# Patient Record
Sex: Male | Born: 1935 | Race: White | Hispanic: No | Marital: Married | Smoking: Former smoker
Health system: Southern US, Community
[De-identification: ages and names within clinical notes are randomized; demographics above are authoritative.]

## PROBLEM LIST (undated history)

## (undated) DIAGNOSIS — K819 Cholecystitis, unspecified: Secondary | ICD-10-CM

## (undated) DIAGNOSIS — I10 Essential (primary) hypertension: Secondary | ICD-10-CM

## (undated) DIAGNOSIS — R109 Unspecified abdominal pain: Secondary | ICD-10-CM

## (undated) DIAGNOSIS — I2510 Atherosclerotic heart disease of native coronary artery without angina pectoris: Secondary | ICD-10-CM

## (undated) DIAGNOSIS — R9430 Abnormal result of cardiovascular function study, unspecified: Secondary | ICD-10-CM

## (undated) DIAGNOSIS — G545 Neuralgic amyotrophy: Secondary | ICD-10-CM

## (undated) DIAGNOSIS — R05 Cough: Secondary | ICD-10-CM

## (undated) DIAGNOSIS — E785 Hyperlipidemia, unspecified: Secondary | ICD-10-CM

## (undated) DIAGNOSIS — IMO0002 Reserved for concepts with insufficient information to code with codable children: Secondary | ICD-10-CM

## (undated) DIAGNOSIS — I739 Peripheral vascular disease, unspecified: Secondary | ICD-10-CM

## (undated) DIAGNOSIS — Z951 Presence of aortocoronary bypass graft: Secondary | ICD-10-CM

## (undated) DIAGNOSIS — M1A9XX0 Chronic gout, unspecified, without tophus (tophi): Secondary | ICD-10-CM

## (undated) DIAGNOSIS — R9389 Abnormal findings on diagnostic imaging of other specified body structures: Secondary | ICD-10-CM

## (undated) DIAGNOSIS — M5030 Other cervical disc degeneration, unspecified cervical region: Secondary | ICD-10-CM

## (undated) DIAGNOSIS — G5600 Carpal tunnel syndrome, unspecified upper limb: Secondary | ICD-10-CM

## (undated) DIAGNOSIS — L4050 Arthropathic psoriasis, unspecified: Secondary | ICD-10-CM

## (undated) DIAGNOSIS — R52 Pain, unspecified: Secondary | ICD-10-CM

## (undated) DIAGNOSIS — M109 Gout, unspecified: Secondary | ICD-10-CM

## (undated) DIAGNOSIS — L409 Psoriasis, unspecified: Principal | ICD-10-CM

## (undated) DIAGNOSIS — M1990 Unspecified osteoarthritis, unspecified site: Secondary | ICD-10-CM

## (undated) DIAGNOSIS — M5136 Other intervertebral disc degeneration, lumbar region: Secondary | ICD-10-CM

## (undated) DIAGNOSIS — N200 Calculus of kidney: Secondary | ICD-10-CM

## (undated) DIAGNOSIS — R945 Abnormal results of liver function studies: Secondary | ICD-10-CM

## (undated) DIAGNOSIS — R7989 Other specified abnormal findings of blood chemistry: Secondary | ICD-10-CM

## (undated) DIAGNOSIS — N289 Disorder of kidney and ureter, unspecified: Secondary | ICD-10-CM

## (undated) DIAGNOSIS — I252 Old myocardial infarction: Secondary | ICD-10-CM

## (undated) DIAGNOSIS — Z79899 Other long term (current) drug therapy: Secondary | ICD-10-CM

## (undated) DIAGNOSIS — M810 Age-related osteoporosis without current pathological fracture: Secondary | ICD-10-CM

## (undated) DIAGNOSIS — E119 Type 2 diabetes mellitus without complications: Secondary | ICD-10-CM

## (undated) DIAGNOSIS — K859 Acute pancreatitis without necrosis or infection, unspecified: Secondary | ICD-10-CM

## (undated) DIAGNOSIS — I779 Disorder of arteries and arterioles, unspecified: Secondary | ICD-10-CM

## (undated) DIAGNOSIS — T464X5A Adverse effect of angiotensin-converting-enzyme inhibitors, initial encounter: Secondary | ICD-10-CM

## (undated) HISTORY — DX: Old myocardial infarction: I25.2

## (undated) HISTORY — PX: CARDIAC CATHETERIZATION: SHX172

## (undated) HISTORY — DX: Disorder of arteries and arterioles, unspecified: I77.9

## (undated) HISTORY — DX: Atherosclerotic heart disease of native coronary artery without angina pectoris: I25.10

## (undated) HISTORY — DX: Abnormal findings on diagnostic imaging of other specified body structures: R93.89

## (undated) HISTORY — DX: Other specified abnormal findings of blood chemistry: R79.89

## (undated) HISTORY — DX: Arthropathic psoriasis, unspecified: L40.50

## (undated) HISTORY — DX: Adverse effect of angiotensin-converting-enzyme inhibitors, initial encounter: T46.4X5A

## (undated) HISTORY — DX: Unspecified osteoarthritis, unspecified site: M19.90

## (undated) HISTORY — DX: Abnormal result of cardiovascular function study, unspecified: R94.30

## (undated) HISTORY — DX: Reserved for concepts with insufficient information to code with codable children: IMO0002

## (undated) HISTORY — PX: CHOLECYSTECTOMY: SHX55

## (undated) HISTORY — DX: Cough: R05

## (undated) HISTORY — DX: Carpal tunnel syndrome, unspecified upper limb: G56.00

## (undated) HISTORY — DX: Psoriasis, unspecified: L40.9

## (undated) HISTORY — DX: Cholecystitis, unspecified: K81.9

## (undated) HISTORY — DX: Hyperlipidemia, unspecified: E78.5

## (undated) HISTORY — DX: Age-related osteoporosis without current pathological fracture: M81.0

## (undated) HISTORY — DX: Disorder of kidney and ureter, unspecified: N28.9

## (undated) HISTORY — DX: Other cervical disc degeneration, unspecified cervical region: M50.30

## (undated) HISTORY — DX: Peripheral vascular disease, unspecified: I73.9

## (undated) HISTORY — PX: CORONARY ARTERY BYPASS GRAFT: SHX141

## (undated) HISTORY — PX: SPINAL FUSION: SHX223

## (undated) HISTORY — DX: Type 2 diabetes mellitus without complications: E11.9

## (undated) HISTORY — DX: Essential (primary) hypertension: I10

## (undated) HISTORY — DX: Acute pancreatitis without necrosis or infection, unspecified: K85.90

## (undated) HISTORY — DX: Neuralgic amyotrophy: G54.5

## (undated) HISTORY — DX: Abnormal results of liver function studies: R94.5

## (undated) HISTORY — DX: Gout, unspecified: M10.9

## (undated) HISTORY — DX: Calculus of kidney: N20.0

## (undated) HISTORY — DX: Chronic gout, unspecified, without tophus (tophi): M1A.9XX0

## (undated) HISTORY — DX: Pain, unspecified: R52

## (undated) HISTORY — DX: Other long term (current) drug therapy: Z79.899

## (undated) HISTORY — DX: Presence of aortocoronary bypass graft: Z95.1

## (undated) HISTORY — DX: Unspecified abdominal pain: R10.9

## (undated) HISTORY — DX: Other intervertebral disc degeneration, lumbar region: M51.36

---

## 2003-08-20 HISTORY — PX: CORONARY ARTERY BYPASS GRAFT: SHX141

## 2004-02-10 ENCOUNTER — Inpatient Hospital Stay (HOSPITAL_COMMUNITY): Admission: RE | Admit: 2004-02-10 | Discharge: 2004-02-22 | Payer: Self-pay | Admitting: Cardiothoracic Surgery

## 2004-02-13 HISTORY — PX: LEFT HEART CATH AND CORONARY ANGIOGRAPHY: CATH118249

## 2004-03-19 ENCOUNTER — Encounter: Admission: RE | Admit: 2004-03-19 | Discharge: 2004-03-19 | Payer: Self-pay | Admitting: Neurosurgery

## 2004-05-02 ENCOUNTER — Encounter: Admission: RE | Admit: 2004-05-02 | Discharge: 2004-05-02 | Payer: Self-pay | Admitting: Neurosurgery

## 2004-11-12 ENCOUNTER — Ambulatory Visit: Payer: Self-pay | Admitting: Cardiology

## 2005-12-04 ENCOUNTER — Ambulatory Visit: Payer: Self-pay | Admitting: Cardiology

## 2007-01-21 ENCOUNTER — Ambulatory Visit: Payer: Self-pay | Admitting: Cardiology

## 2008-01-20 ENCOUNTER — Ambulatory Visit: Payer: Self-pay | Admitting: Cardiology

## 2009-02-17 ENCOUNTER — Encounter: Payer: Self-pay | Admitting: Cardiology

## 2009-02-21 ENCOUNTER — Ambulatory Visit: Payer: Self-pay | Admitting: Cardiology

## 2009-02-28 ENCOUNTER — Encounter: Payer: Self-pay | Admitting: Cardiology

## 2009-10-03 ENCOUNTER — Encounter: Payer: Self-pay | Admitting: Cardiology

## 2010-02-16 DIAGNOSIS — R52 Pain, unspecified: Secondary | ICD-10-CM

## 2010-02-16 HISTORY — DX: Pain, unspecified: R52

## 2010-02-28 ENCOUNTER — Encounter: Payer: Self-pay | Admitting: Cardiology

## 2010-03-01 ENCOUNTER — Ambulatory Visit: Payer: Self-pay | Admitting: Cardiology

## 2010-03-20 ENCOUNTER — Telehealth (INDEPENDENT_AMBULATORY_CARE_PROVIDER_SITE_OTHER): Payer: Self-pay | Admitting: Radiology

## 2010-03-21 ENCOUNTER — Encounter (HOSPITAL_COMMUNITY): Admission: RE | Admit: 2010-03-21 | Discharge: 2010-05-10 | Payer: Self-pay | Admitting: Cardiology

## 2010-03-21 ENCOUNTER — Ambulatory Visit: Payer: Self-pay

## 2010-03-21 ENCOUNTER — Encounter: Payer: Self-pay | Admitting: Cardiovascular Disease

## 2010-03-21 ENCOUNTER — Ambulatory Visit: Payer: Self-pay | Admitting: Cardiovascular Disease

## 2010-03-21 ENCOUNTER — Encounter: Payer: Self-pay | Admitting: Cardiology

## 2010-09-18 NOTE — Miscellaneous (Signed)
  Clinical Lists Changes  Observations: Added new observation of PAST MED HX:  * NORMAL LV FUNCTION CABG   2005 CAD (ICD-414.00) (no exercise or echo data since CABG as a February 28, 2010) RENAL INSUFFICIENCY (ICD-588.9) DIABETES MELLITUS (ICD-250.00) HYPERLIPIDEMIA (ICD-272.4) HYPERTENSION, BENIGN (ICD-401.1)  (02/28/2010 17:25) Added new observation of PRIMARY MD: Lucila Maine, MD (02/28/2010 17:25)       Past History:  Past Medical History:  * NORMAL LV FUNCTION CABG   2005 CAD (ICD-414.00) (no exercise or echo data since CABG as a February 28, 2010) RENAL INSUFFICIENCY (ICD-588.9) DIABETES MELLITUS (ICD-250.00) HYPERLIPIDEMIA (ICD-272.4) HYPERTENSION, BENIGN (ICD-401.1)

## 2010-09-18 NOTE — Assessment & Plan Note (Signed)
Summary: [facility]   Visit Type:  Follow-up Primary Provider:  Lucila Maine, MD  CC:  CAD.  History of Present Illness: The patient is seen for followup coronary artery disease.  His overall cardiac status appears to be stable.  I saw him last July, 2010.  He is status post CABG in 2005.  He has not had any exercise study since that time.  When I saw him last year I plan see with exercise testing this year.  The patient is bothered by several areas of pain.  He has pain along his right neck to the top of his right shoulder.  There is pain in his right wrist and in his legs.  He had a steroid injection that helped the wrist.  He continues to be bothered however by several areas of pain.  There is no significant shortness of breath.  Current Medications (verified): 1)  Aspirin 81 Mg Tbec (Aspirin) .... Take One Tablet By Mouth Daily 2)  Carvedilol 25 Mg Tabs (Carvedilol) .... Take One Tablet By Mouth Twice A Day 3)  Lotrel 5-20 Mg Caps (Amlodipine Besy-Benazepril Hcl) .... Take One Tablet By Mouth Once Daily. 4)  Crestor 10 Mg Tabs (Rosuvastatin Calcium) .... Take One Tablet By Mouth Daily. 5)  Metformin Hcl 1000 Mg Tabs (Metformin Hcl) .... Take One Tablet By Mouth Twice Daily. 6)  Lisinopril-Hydrochlorothiazide 20-12.5 Mg Tabs (Lisinopril-Hydrochlorothiazide) .... Take One Tablet By Mouth Once Daily. 7)  Finasteride 5 Mg Tabs (Finasteride) .... Take One Tablet By Mouth Once Daily. 8)  Study Drug .... For Gout  Allergies (verified): No Known Drug Allergies  Past History:  Past Medical History:  * NORMAL LV FUNCTION CABG   2005 CAD (ICD-414.00) (no exercise or echo data since CABG as a February 28, 2010) RENAL INSUFFICIENCY (ICD-588.9) DIABETES MELLITUS (ICD-250.00) HYPERLIPIDEMIA (ICD-272.4) HYPERTENSION, BENIGN (ICD-401.1) Pain.... right neck and shoulder.... right wrist.... right leg.... not cardiac... July, 2011  Review of Systems       Patient denies fever, chills, headache, sweats,  rash, change in vision, change in hearing, chest pain, shortness of breath, cough, nausea or vomiting, urinary symptoms.  All of the systems are reviewed and are negative.  Vital Signs:  Patient profile:   77 year old male Height:      70 inches Weight:      186 pounds BMI:     26.78 Pulse rate:   77 / minute Resp:     16 per minute BP sitting:   110 / 66  (left arm)  Vitals Entered By: Marrion Coy, CNA (March 01, 2010 9:31 AM)  Physical Exam  General:  patient is stable.  He does have discomfort in the shoulder wrist and right leg. Head:  head is atraumatic. Eyes:  no xanthelasma. Neck:  no jugular venous distention. Chest Wall:  no chest wall tenderness. Lungs:  lungs are clear.  Respiratory effort is nonlabored. Heart:  cardiac exam reveals S1 and S2.  There is no clicks or significant murmurs. Abdomen:  abdomen is soft. Msk:  no musculoskeletal deformities. Extremities:  no peripheral edema.  There is no heat or redness in the right shoulder or right wrist or in his right leg. Skin:  no skin rashes. Psych:  patient is oriented to person time and place.  Affect is normal.  He is here with his wife today.   Impression & Recommendations:  Problem # 1:  * PAIN RIGHT NECK AND SHOULDER / RIGHT WRIST / RIGHT LEG This time  the patient is bothered by the pain in his neck and shoulder and wrist and right leg.  I feel that these pains are noncardiac in origin.  It is possible that the right neck and shoulder pain may lead to the need for possible surgery.  I had planned to proceed with exercise testing this year as it is 6 years since his CABG.  We will arrange for a Lexiscan Myoview.  Problem # 2:  CAD (ICD-414.00)  Coronary disease appears to be stable at this time.  EKG is done and reviewed by me.  It is normal.  We do need to proceed with a nuclear exercise study.  Orders: EKG w/ Interpretation (93000) Nuclear Stress Test (Nuc Stress Test)  Problem # 3:  HYPERLIPIDEMIA  (ICD-272.4) Lipids are being treated.  Problem # 4:  CAROTID ARTERY DISEASE (ICD-433.10)  The patient has known carotid artery disease.  It is time for followup carotid Doppler.  This will be arranged.  Orders: Carotid Duplex (Carotid Duplex)  Patient Instructions: 1)  Your physician has requested that you have a carotid duplex. This test is an ultrasound of the carotid arteries in your neck. It looks at blood flow through these arteries that supply the brain with blood. Allow one hour for this exam. There are no restrictions or special instructions. 2)  Your physician has requested that you have a Lexiscan myoview.  For further information please visit [email or web address]  Please follow instruction sheet, as given. 3)  Your physician wants you to follow-up in: 1 year.  You will receive a reminder letter in the mail two months in advance. If you don't receive a letter, please call our office to schedule the follow-up appointment.

## 2010-09-18 NOTE — Progress Notes (Signed)
Summary: Nuc pre-procedure  Phone Note Outgoing Call Call back at Home Phone [phone]   Call placed by: Darrick Penna Summary of Call: Reviewed information on Myoview Information Sheet (see scanned document for further details).  Spoke with      Nuclear Med Background Indications for Stress Test: Evaluation for Ischemia, Graft Patency   History: CABG, Heart Catheterization, Myocardial Infarction  History Comments: 05 CABG x3. 05 Cath EF=60%. 05 MI NSTEMI     Nuclear Pre-Procedure Cardiac Risk Factors: Hypertension, Lipids, NIDDM Height (in): 70

## 2010-09-18 NOTE — Assessment & Plan Note (Signed)
Summary: Cardiology Nuclear Testing  Nuclear Med Background Indications for Stress Test: Evaluation for Ischemia, Graft Patency   History: CABG, Heart Catheterization, Myocardial Infarction  History Comments: 05 CABG x3. 05 Cath EF=60%. 05 MI NSTEMI  Symptoms: DOE, Palpitations    Nuclear Pre-Procedure Cardiac Risk Factors: Hypertension, Lipids, NIDDM Caffeine/Decaff Intake: none NPO After: 5:00 PM Lungs: clear IV 0.9% NS with Angio Cath: 22g     IV Site: (R) AC IV Started by: Irean Hong RN Chest Size (in) 42     Height (in): 70 Weight (lb): 182 BMI: 26.21 Tech Comments: Held Carvedilol and Metformin this AM  Nuclear Med Study 1 or 2 day study:  1 day     Stress Test Type:  Eugenie Birks Reading MD:  Charlton Haws, MD     Referring MD:  J.Katz Resting Radionuclide:  Technetium 15m Tetrofosmin     Resting Radionuclide Dose:  10.9 mCi  Stress Radionuclide:  Technetium 39m Tetrofosmin     Stress Radionuclide Dose:  33.0 mCi   Stress Protocol   Lexiscan: 0.4 mg   Stress Test Technologist:  Milana Na EMT-P     Nuclear Technologist:  Domenic Polite CNMT  Rest Procedure  Myocardial perfusion imaging was performed at rest 45 minutes following the intravenous administration of Myoview Technetium 75m Tetrofosmin.  Stress Procedure  The patient received IV Lexiscan 0.4 mg over 15-seconds.  Myoview injected at 30-seconds.  There were no significant changes with infusion.  Quantitative spect images were obtained after a 45 minute delay.  QPS Raw Data Images:  Normal; no motion artifact; normal heart/lung ratio. Stress Images:  NI: Uniform and normal uptake of tracer in all myocardial segments. Rest Images:  Normal homogeneous uptake in all areas of the myocardium. Subtraction (SDS):  0 Transient Ischemic Dilatation:  .92  (Normal <1.22)  Lung/Heart Ratio:  .30  (Normal <0.45)  Quantitative Gated Spect Images QGS EDV:  78 ml QGS ESV:  30 ml QGS EF:  61 % QGS cine  images:  normal  Findings Low risk nuclear study  Evidence for inferior infarct     Overall Impression  Exercise Capacity: Lexiscan BP Response: Normal blood pressure response. Clinical Symptoms: No chest pain ECG Impression: No significant ST segment change suggestive of ischemia. Overall Impression: Small inferior wall infarct at mid and basal level No ishemia  Appended Document: Cardiology Nuclear Testing Good  Appended Document: Cardiology Nuclear Testing pt aware

## 2010-11-05 ENCOUNTER — Other Ambulatory Visit (HOSPITAL_COMMUNITY): Payer: Self-pay | Admitting: Rheumatology

## 2010-11-05 ENCOUNTER — Ambulatory Visit (HOSPITAL_COMMUNITY)
Admission: RE | Admit: 2010-11-05 | Discharge: 2010-11-05 | Disposition: A | Discharge status: Home / Self Care | Payer: Medicare Other | Source: Ambulatory Visit | Attending: Rheumatology | Admitting: Rheumatology

## 2010-11-05 DIAGNOSIS — Z01818 Encounter for other preprocedural examination: Secondary | ICD-10-CM | POA: Insufficient documentation

## 2010-11-05 DIAGNOSIS — D869 Sarcoidosis, unspecified: Secondary | ICD-10-CM

## 2011-01-01 NOTE — Assessment & Plan Note (Signed)
[facility]                            CARDIOLOGY OFFICE NOTE   Dan Holmes, Dan Holmes                  MRN:          [id number]  DATE:01/20/2008                            DOB:          01/03/1932    Dan Holmes is doing well from the cardiac viewpoint.  He is not  having shortness of breath (which was his original anginal symptom).  He  is active.  He is having some continued back difficulties.  I encouraged  him to see Dr. Gerlene Fee again, and I am sending a copy of this note to  Dr. Gerlene Fee.  The patient is post CABG from 2005, and he is not having  any chest pain.   PAST MEDICAL HISTORY:   ALLERGIES:  No known drug allergies.   MEDICATIONS:  1. Aspirin.  2. Coreg.  3. Lotrel.  4. Vytorin (to be switched to Crestor soon).  5. Allopurinol.  6. Finasteride.  7. Metformin.  8. Lisinopril/hydrochlorothiazide.   OTHER MEDICAL PROBLEMS:  See the list below.   REVIEW OF SYSTEMS:  Other than his back discomfort, he feels fine.  Review of systems otherwise is negative.   PHYSICAL EXAMINATION:  Weight is 206 pounds, which is increased by 4  pounds since last year.  We discussed this issue at length.  Blood  pressure is 130/70 with a pulse of 72.  The patient is oriented to person, time, and place.  Affect is normal.  He is here with his wife today.  HEENT:  No xanthelasma.  He has normal extraocular motion.  There are no  carotid bruits.  There is no jugular venous distention.  LUNGS:  Clear.  Respiratory effort is not labored.  CARDIAC:  S1 with an S2.  There are no clicks or significant murmurs.  ABDOMEN:  Soft.  He has no peripheral edema.   Labs sent from Dr. Lorin Picket reveal an HDL of 42 and an LDL of 74.  His EKG  today shows a sinus rhythm.  Creatinine was 1.4.   PROBLEMS:  1. Hypertension, treated.  2. Hyperlipidemia, being treated well by Dr. Lorin Picket.  3. Diabetes, treated.  4. History of renal insufficiency.  His creatinine has  been as high as      1.7 by history and most recently is 1.4.  5. Non-ST elevation myocardial infarction at the time when I first met      him as he came in to the recovery room after neurosurgery on his      back.  6. Severe coronary disease, post coronary artery bypass graft in 2005.  7. Normal left ventricular function.   Dan Holmes is doing well.  He will continue his current medications.  I have urged him to try to lose some weight.  I will see him back in a  year.  At that time, we will consider whether exercise testing or echo  are appropriate.     Dan Abed, MD, [facility]  Electronically Signed    JDK/MedQ  DD: 01/20/2008  DT: 01/20/2008  Job #: [id number]   cc:   Dr. Denese Killings  Sonda Primes, M.D.

## 2011-01-01 NOTE — Assessment & Plan Note (Signed)
[facility]                            CARDIOLOGY OFFICE NOTE   DEV, DHONDT                  MRN:          [id number]  DATE:01/21/2007                            DOB:          1931-12-16    Mr. Monnier is back for cardiology followup.  He is doing well.  He  has some vague chest discomfort that does not sound like his angina.  He  is fully active.  He is not having any shortness of breath.  He has no  syncope or presyncope.  He does have a history of renal stones and, in  the past year, he had to have lithotripsy done.  This was done  successfully in [place] and he is doing quite well.   He is post-CABG.  This was done in 2005.   PAST MEDICAL HISTORY:   ALLERGIES:  No known drug allergies.   MEDICATIONS:  1. Aspirin 325 (to be reduced to 81 mg).  2. Coreg 25.  3. Lotrel 5/20.  4. Vytorin 10/10.  5. Avandamet.  6. Allopurinol.  7. Finasteride.   OTHER MEDICAL PROBLEMS:  See the list below.   REVIEW OF SYSTEMS:  He feels well at this time.  He has had only vague  chest discomfort, as described above.  Otherwise, his review of systems  is negative.   PHYSICAL EXAM:  Weight is 202 pounds, which is stable.  Blood pressure  is 119/71 with a pulse of 75.  Patient is oriented to person, time and  place.  Affect is normal.  He is here with his wife today.  He has no  carotid bruits.  There is no jugular venous distention.  LUNGS:  Clear.  Respiratory effort is not labored.  CARDIAC EXAM:  Reveals an S1 and S2.  There are no clicks or significant murmurs.  His ABDOMEN is soft.  He  has normal bowel sounds.  There is no peripheral edema.   EKG reveals nonspecific STT-wave changes.   IMPRESSION:  1. Hypertension, treated.  2. Hyperlipidemia, treated.  I have labs from Dr. Lorin Picket, revealing      that the patient's LDL was in the range of 64 in March of 2008 with      an HDL of 45 and triglycerides of 109.  3. Diabetes, being  treated.  4. History of renal insufficiency, creatinine 1.7 by history.  5. Non-ST-elevation myocardial infarction at the time that we first      met him after his neurosurgery on his back.  6. Severe coronary disease, post-CABG in 2005.  7. Good left ventricular function.   Mr. Eiland is stable.  No change in his medications.  I will see him  back in a year.     Luis Abed, MD, [facility]  Electronically Signed    JDK/MedQ  DD: 01/21/2007  DT: 01/21/2007  Job #: [id number]   cc:   Dr. Lucila Maine, [place] 

## 2011-01-04 NOTE — Discharge Summary (Signed)
NAME:  Dan Holmes, Dan Holmes                     ACCOUNT NO.:  [id number]   MEDICAL RECORD NO.:  [id number]                   PATIENT TYPE:  INP   LOCATION:  2039                                 FACILITY:  [facility]   PHYSICIAN:  Kerin Perna, M.D.               DATE OF BIRTH:  09/08/1931   DATE OF ADMISSION:  02/10/2004  DATE OF DISCHARGE:  02/22/2004                                 DISCHARGE SUMMARY   PRIMARY ADMISSION DIAGNOSES:  1. Bilateral lower extremity pain.  2. Back pain.   ADDITIONAL/DISCHARGE DIAGNOSES:  1. Bilateral lower extremity pain.  2. Back pain.  3. Herniated disk at L4-5 central with marked spinal stenosis.  4. Postoperative subendocardial myocardial infarction.  5. Class IV unstable angina.  6. Severe three vessel coronary artery disease.  7. Type 2 non-insulin dependent diabetes mellitus.  8. Hypertension.  9. Hyperlipidemia.  10.      Renal insufficiency with baseline creatinine of 1.7.   PROCEDURES PERFORMED:  1. Bilateral L4 and L5 decompressive laminectomy followed by bilateral     microdiskectomy followed by posterior lumbar interbody fusion with     autologous bone graft and Nuvasive bony spacers followed by L4 and L5     transthoracic screw fixation.  2. Microdissection of L4 and L5 disks and L5 nerve roots bilaterally.  3. Intraoperative electromyography monitoring.  4. Cardiac catheterization.  5. Coronary artery bypass grafting x5 (left internal mammary artery to the     LAD, saphenous vein graft sequentially to the first and second obtuse     marginals, sequential saphenous vein graft to the right coronary artery     and posterior descending artery).  6. Endoscopic vein harvest, right thigh and lower leg.   HISTORY:  The patient is a 77 year old male with a history of back pain and  lower extremity pain who was found to have herniated disks at L4 and L5 with  marked spinal stenosis.  Because of this, he was seen by Dr. Gerlene Fee and was  felt  to require surgery.  He was admitted to [facility] on February 10, 2004, to  proceed laminectomy and microdiskectomy.   HOSPITAL COURSE:  The patient was admitted on February 10, 2004, and was taken  to the operating room where he underwent L4 and L5 laminectomy with  microdiskectomy and microdissection of L4 and L5 as described in detail  above.  In the immediate postoperative period the patient had some chest  discomfort which was similar to pain that he had attributed to indigestion  in the past.  On his telemetry he was noted to have mild ST depression.  An  EKG was obtained which showed new mild lateral ST depression and very slight  inferior ST elevation.  Because of this finding, cardiology evaluation was  ordered.  The patient was seen by Dr. Jerral Bonito and was found to have an  acute subendocardial myocardial infarction.  He was  transferred to the  coronary care unit, was treated with IV beta blockers, nitroglycerin, and  aspirin.  Because of his recent surgery, he was not treated with IV  anticoagulants.  This controlled his chest pain and he stabilized.  He  underwent diagnostic cardiac catheterization on February 13, 2004.  He was found  to have severe three vessel coronary artery disease which was not felt to be  amenable to percutaneous intervention.  A cardiothoracic surgery  consultation was obtained.  The patient was seen by Dr. Kathlee Nations Trigt who  felt that his best option would be to proceed with coronary artery bypass  grafting.  After explanation of the risks, benefits, and alternatives the  patient agreed to proceed.  He did develop an additional episode of chest  pain between the time of his catheterization and his surgery.  He was  continued on IV nitroglycerin and was started on heparin at that time.  His  pain resolved and he remained stable and pain-free until the time of his  surgery.  He was taken to the operating room on February 16, 2004, where he  underwent CABG x5 as  described in detail above.  He tolerated the procedure  well and was transferred to the SICU in stable condition.  He was able to be  extubated shortly after surgery.  He was hemodynamically stable and doing  well on postoperative day #1.  His chest tubes and hemodynamic monitoring  devices were removed and he was kept in the unit for further observation.  A  physical therapy consult was obtained and he was able to be mobilized while  in the unit.  On postoperative day #2, his creatinine bumped up from his  baseline of 1.7 to a high of 3.6.  His urine output remained adequate and he  was started on gentle diuresis.  He had a brief episode of atrial flutter  with rates in the 120s.  After rapid atrial pacing, he was converted to  normal sinus rhythm at a rate of 84.  He was started on Amiodarone and was  continued on Lopressor.  By postoperative day #3, he was ready for transfer  to the floor.  He was initially maintained on the Glucomander protocol for  his diabetes until he was extubated.  At that time, he was started on  Lantus, and this was continued until his p.o. intake had improved enough to  restart his home medication regimen.  Overall, he has done very well  postoperatively.  His blood sugars have been fairly well controlled, running  somewhere around 120 to 130 on his home medications.  His surgical incision  sites are all healing well.  He is still quite volume overloaded and is  about 10 pounds above his preoperative weight, although he is diuresing very  well.  On physical examination he has trace lower extremity edema.  His  lungs are clear.  He has maintained normal sinus rhythm, and because he has  stayed in sinus rhythm the Amiodarone was discontinued.  He is presently  being maintained on Lopressor solely and is doing well.  His most recent  labs show a hemoglobin of 9.3, hematocrit 27.2, white count 4.7, platelets 223.  Potassium 4, BUN 20, creatinine 1.7.  His surgical  incision sites are  all healing well.  He is ambulating in the halls without difficulty.  He has  remained afebrile and all vital signs have been stable.  He has been weaned  off  supplemental oxygen and is maintaining O2 saturations of greater than  90% on room air.  It is felt that since he is remaining stable and doing  well he may be discharged home at this time.   DISCHARGE MEDICATIONS:  1. Enteric coated aspirin 325 mg daily.  2. Lopressor 25 mg b.i.d.  3. Vitorin 10/20 mg one daily.  4. Avandia 4 mg daily.  5. Lasix 40 mg daily x1 week.  6. K-Dur 20 mEq daily x1 week.  7. Metformin 500 mg b.i.d.  8. Tylox one to two q.4h. p.r.n. pain.   DISCHARGE INSTRUCTIONS:  1. He is to refrain from driving, heavy lifting, or strenuous activity.  He     may continue daily ambulation and use of his incentive spirometer.  2. He is asked to shower daily and clean his incisions with soap and water.  3. He will continue a low fat, low sodium, diabetic restricted diet.   FOLLOWUP:  1. Home health nursing has been arranged to follow the patient for cardiac     surgery postoperative care.  2. He will see Dr. Myrtis Ser in the [facility] office in two weeks.  He     will have chest x-ray at that visit.  3. He will then see Dr. Donata Clay on March 16, 2004, at 11:45 a.m.  He is     asked to bring his chest x-ray to this appointment for Dr. Donata Clay to     review.  4. He is also asked to call and schedule an appointment to see Dr. Gerlene Fee     in the next one to two weeks.   In the interim, if he experiences any problems or has questions, he is asked  to contact our office immediately.      Coral Ceo, P.A.                        Kerin Perna, M.D.    GC/MEDQ  D:  02/22/2004  T:  02/23/2004  Job:  [id number]   cc:   Reinaldo Meeker, M.D.  [address]  [place]  [place] [address]  Fax: [phone]   Willa Rough, M.D.   Dr. Lucila Maine  [facility]   [place], [place]

## 2011-01-04 NOTE — Consult Note (Signed)
NAME:  Dan Holmes, Dan Holmes                     ACCOUNT NO.:  [id number]   MEDICAL RECORD NO.:  [id number]                   PATIENT TYPE:  INP   LOCATION:  2928                                 FACILITY:  [facility]   PHYSICIAN:  Kerin Perna III, M.D.           DATE OF BIRTH:  01-Sep-1931   DATE OF CONSULTATION:  02/13/2004  DATE OF DISCHARGE:                                   CONSULTATION   REASON FOR CONSULTATION:  Subendocardial MI, class IV unstable angina,  severe three-vessel coronary artery disease.   PHYSICIAN REQUESTING CONSULTATION:  Charlies Constable, M.D.   CHIEF COMPLAINT:  Chest pain.   PRIMARY CARE PHYSICIAN:  Lucila Maine, M.D., [place] office in  [place], [place].   HISTORY OF PRESENT ILLNESS:  I was asked to evaluate this 77 year old white  male diabetic for potential surgical coronary revascularization for recently-  diagnosed severe three-vessel coronary artery disease.  The patient  underwent a lumbar laminectomy with autologous fusion on June 24 by Dr.  Gerlene Fee.  In the recovery room he had epigastric pain, indigestion, and  discomfort, with EKG changes.  His cardiac enzymes were found to be mildly  elevated with a CPK-MB of 20 ng/mL.  Due to his recent surgery  anticoagulation was withheld, but he was given aspirin, nitroglycerin, and  IV beta blockers.  This controlled his chest pain, and his cardiac enzymes  remained at a low level.  He underwent diagnostic cardiac catheterization  today by Dr. Charlies Constable, which demonstrated severe three-vessel coronary  disease with high-grade stenosis of the LAD, high-grade stenosis of the OM-1  and OM-2, and a proximal 95% stenosis of the right coronary with diffuse  high-grade stenosis of the posterior descending as well.  His inferior wall  was hypokinetic, and his ejection fraction was approximately 45%.  He was  felt to be a candidate for surgical revascularization based on his bad three-  vessel coronary  anatomy and recent symptoms of unstable angina.   The patient has had some postprandial discomfort over the past several  weeks.  He has no known history of prior coronary disease or myocardial  infarction.  The patient is currently stable in the CCU following his  cardiac catheterization.  He states he has not been out of bed since his  lumbar laminectomy on June 24.   PAST MEDICAL HISTORY:  1. Type 2 diabetes.  2. Hypertension.  3. Hyperlipidemia.  4. Renal insufficiency with a baseline creatinine of 1.7.  5. No known drug allergies.   CURRENT MEDICATIONS:  1. Lotrel 5/20 mg daily.  2. Vytorin 10/20 mg daily.  3. Avandia 4 mg daily.  4. Metformin 500 mg b.i.d.  5. Maxzide one-half tablet daily.   SOCIAL HISTORY:  The patient is retired from [employer] in [place]  and lives with his wife.  He does not smoke.   FAMILY HISTORY:  Positive for coronary artery disease.   REVIEW  OF SYSTEMS:  He denies any systemic symptoms of fever or weight loss.  ENT:  Negative for change in vision or difficulty swallowing.  PULMONARY:  Negative for abnormal chest x-ray, shortness of breath, thoracic trauma, or  recent symptoms of bronchitis or pneumonia.  CARDIAC:  Negative for prior  history of heart problems, arrhythmia, or murmur.  GASTROINTESTINAL:  Negative for gallstones, change in bowel habits, or blood per rectum.  UROLOGIC:  Negative for nocturia or hesitancy.  VASCULAR:  Negative for  claudication, TIA, or DVT.  MUSCULOSKELETAL:  Negative for fractures of his  lower extremities.  NEUROLOGIC:  Negative for stroke or seizure.   PHYSICAL EXAMINATION:  VITAL SIGNS:  The patient is 5 feet 11 inches and  weighs 180 pounds.  Blood pressure is 110/70, pulse 70 and regular,  respirations 18, and he is afebrile  GENERAL:  An elderly male in no distress following cardiac catheterization  in the CCU.  He denies chest pain.  HEENT:  ENT reveals normocephalic, full EOMs, pharynx clear, no  active  dental problems.  NECK:  Without JVD, mass, or carotid bruit.  CHEST:  Lungs are clear, and there is no thoracic deformity.  CARDIAC:  Regular S1 and S2 without murmur or rub.  ABDOMEN:  Soft, nontender, without mass.  RECTAL:  Deferred.  VASCULAR:  2+ pulses in all extremities.  SKIN:  Without rash or lesion.  NEUROLOGIC:  Alert and oriented x3 with full motor function, no focal  deficit, although currently he is restricted to bed rest.   LABORATORY DATA:  I reviewed his coronary arteriograms and agree with Dr.  Regino Schultze interpretation of severe three-vessel coronary disease with fairly  well-preserved LV function except for inferior wall hypokinesia.  His  creatinine is 1.7, his BUN is 25.  His hematocrit is 45%.  Chest x-ray shows  no active disease.   PLAN:  The patient would be a candidate for surgical revascularization and  should benefit from bypass grafts to the LAD, OM-1, OM-2, right coronary,  and possibly the posterior descending, although it has diffuse disease.  Surgery will be tentatively scheduled for June 30.  I have reviewed the plan  with the patient and he is in agreement to proceed.   Thank you very much for the consultation.                                               Mikey Bussing, M.D.    PV/MEDQ  D:  02/13/2004  T:  02/14/2004  Job:  [id number]   cc:   Charlies Constable, M.D. [facility]   Lucila Maine, M.D.  [place], [place]

## 2011-01-04 NOTE — Cardiovascular Report (Signed)
NAME:  Dan Holmes, Dan Holmes                     ACCOUNT NO.:  [id number]   MEDICAL RECORD NO.:  [id number]                   PATIENT TYPE:  INP   LOCATION:  2928                                 FACILITY:  [facility]   PHYSICIAN:  Charlies Constable, M.D. [facility]              DATE OF BIRTH:  1931/10/11   DATE OF PROCEDURE:  02/13/2004  DATE OF DISCHARGE:                              CARDIAC CATHETERIZATION   PROCEDURE:  Cardiac catheterization.   CARDIOLOGIST:  Charlies Constable, M.D.   INDICATIONS:  Dan Holmes is 77 years old and had back surgery on Friday  for a ruptured disk.  Perioperatively, he developed chest pain, and was seen  in consultation by Dr. Myrtis Holmes.  He had some lateral ST depression and minimal  ST elevation in the inferior leads.  Because of his recent surgery, we  elected not to give him heparin and treated him with beta blockers, IV  nitrates and aspirin.  He settled down, and his troponins returned positive  for a non-ST elevation infarction.  He was scheduled for evaluation with  angiography today.   DESCRIPTION OF PROCEDURE:  The procedure was performed via the right femoral  artery using an arterial sheath and 6 French preformed coronary catheters.  A front wall arterial puncture was performed, and Omnipaque contrast was  used.  A distal aortogram was performed to rule out renovascular causes for  his renal insufficiency.  He tolerated the procedure well and left the  laboratory in satisfactory condition.   RESULTS:  PRESSURES:  Aortic pressure 148/67, mean of 101.  Left ventricular  pressure was 148/22.   CORONARY ARTERIES:  The left main coronary artery was free of significant disease.   Left anterior descending:  The left anterior descending artery gave rise to  four diagonal branches and three septal perforators.  There was a long area  of segmental narrowing estimated at 80% in the proximal LAD.  There is  moderate calcification.   The circumflex artery gave rise to  a ramus branch and marginal branch in the  posterior lateral branch. There were 80 and 95% stenoses in the mid  circumflex artery after the marginal branch.  There was 50% narrowing in the  marginal branch.   The right coronary artery is a dominant vessel.  It gave rise to a posterior  descending and three posterior lateral branches.  There was 90% ostial  stenosis.  There was what appeared to be a ruptured plaque in the mid right  coronary artery which was only about 50% narrowed.  The posterior descending  branch had 90% and 80% stenoses.   The left ventriculogram was performed in the RAO projection and showed  hypokinesis of the mid inferior wall.  The overall wall motion was good with  an estimated ejection fraction of 60%.   A distal aortogram was performed.  It showed patent renal arteries and no  major aortoiliac obstruction.   CONCLUSION:  Severe three vessel coronary artery disease with 30% narrowing  in the proximal left anterior descending artery, 95% narrowing in the mid  circumflex artery with 50% narrowing in the first marginal branch, 90%  ostial stenosis and 50% mid stenosis in the right coronary artery with 90  and 80% stenosis in the posterior descending branch and inferior wall  hypokinesis with an estimated ejection fraction of 60%/   RECOMMENDATIONS:  The patient has had a recent non-ST elevation infarction  perioperatively following low back surgery.  He has severe three vessel  disease, and I think surgical revascularization is the best option.  We will  plan surgical consultation for probable surgery later in the week.                                               Charlies Constable, M.D. [facility]    BB/MEDQ  D:  02/13/2004  T:  02/14/2004  Job:  [id number]   cc:   Dan Holmes, M.D.  [address]  [place]  [place] [address]  Fax: [phone]   Dan Holmes, M.D.   Cardiopulmonary Lab

## 2011-01-04 NOTE — Op Note (Signed)
NAME:  Dan Holmes, Dan Holmes                     ACCOUNT NO.:  [id number]   MEDICAL RECORD NO.:  [id number]                   PATIENT TYPE:  INP   LOCATION:  2309                                 FACILITY:  [facility]   PHYSICIAN:  Kerin Perna III, M.D.           DATE OF BIRTH:  1932/06/30   DATE OF PROCEDURE:  02/16/2004  DATE OF DISCHARGE:                                 OPERATIVE REPORT   OPERATION PERFORMED:  Coronary artery bypass grafting times five (left  internal mammary artery to left anterior descending, sequential saphenous  vein graft to obtuse marginal 1 and obtuse marginal 2, sequential saphenous  vein graft to right coronary artery and posterior descending).   PREOPERATIVE DIAGNOSIS:  Class 4 unstable angina, subendocardial myocardial  infarction, severe three vessel coronary artery disease.   POSTOPERATIVE DIAGNOSIS:  Class 4 unstable angina, subendocardial myocardial  infarction, severe three vessel coronary artery disease.   SURGEON:  Kerin Perna, M.D.   ASSISTANT:  Coral Ceo, P.A.   ANESTHESIA:  General.   ANESTHESIOLOGIST:  Maren Beach, M.D.   INDICATIONS FOR PROCEDURE:  The patient is a 77 year old male diabetic who  developed an acute myocardial infarction following lumbar laminectomy and  lumbar fusion.  Cardiac catheterization demonstrated severe three vessel  coronary artery disease with ejection fraction of 50%.  He is felt  to be a  candidate for surgical revascularization.  Prior to surgery, I examined the  patient in his CCU room and reviewed the results of the cardiac  catheterization with the patient and family.  I discussed the indications  and expected benefits of coronary artery bypass grafting for treatment of  his severe coronary artery disease.  I discussed the alternatives to  surgical therapy for treatment of his coronary artery disease as well.  I  reviewed with the patient and the family the major aspects of the proposed  procedure including the choice of conduit for grafts, the location of the  surgical incisions, the use of general anesthesia, and cardiopulmonary  bypass, and the expected postoperative hospital recovery.  I discussed with  the patient the risks to him of coronary artery bypass surgery including the  risks of MI, CVA, bleeding, blood transfusion requirement, infection and  death.  The patient understood with preoperative anemia that the likelihood  of intraoperative transfusion was very high.  He understood these  implications for the surgery and agreed to proceed with the operation as  planned under what I felt was an informed consent.   OPERATIVE FINDINGS:  The patient's coronaries were diffusely diseased with a  diabetic type pattern.  The vein was harvested endoscopically from the right  leg and was good conduit.  The mammary artery was somewhat thickened but  with excellent flow.  The patient received three units of packed cells  intraoperatively for hematocrit starting at 24%.   DESCRIPTION OF PROCEDURE:  The patient was brought to the operating room and  placed supine on the operating table where general anesthesia was induced  under invasive hemodynamic monitoring.  The chest, abdomen and legs were  prepped with Betadine and draped as a sterile field.  A sternal incision was  then made as the saphenous vein was harvested from the right leg.  The left  internal mammary artery was harvested as a pedicle graft from its origin at  the subclavian vessels and was a good vessel with excellent flow.  Heparin  was administered and the ACT was documented as being therapeutic.  The  sternal retractor was placed and the pericardium suspended.  Through  pursestrings placed in the ascending aorta and right atrium, the patient was  cannulated and placed on bypass and cooled to 32 degrees. The coronaries  were identified for grafting.  The mammary artery and vein grafts were  prepared for the  distal anastomoses and a cardioplegia cannula was placed  both in the ascending aorta and to the coronary sinus for cold blood  cardioplegia.  The patient was cooled to 32 degrees.  The aortic cross-clamp  was applied and 800 mL of cold blood cardioplegia was delivered in split  doses between the antegrade aortic and retrograde coronary sinus catheters.  There was a good cardioplegic arrest and septal temperature dropped less  than 14 degrees.  Topical iced saline was used to augment myocardial  preservation and a pericardial insulator pad was used to protect the left  phrenic nerve.  The distal coronary anastomoses were then performed.   The first distal anastomosis was to the right coronary artery continuing  with a sequential graft to the posterior descending.  The right coronary was  a 2.0 mm vessel with proximal 95% ostial stenosis.  A side-to-side  anastomosis with a reversed saphenous vein was sewn with running 7-0 Prolene  and there was good flow through the graft.  The second distal anastomosis  was the continuation of the sequential vein to the posterior descending.  This had distal diabetic type disease and was a 1.5 mm vessel.  The end of  the vein was sewn end-to-side with running 7-0 Prolene. There was good flow  through the graft.  Cardioplegia was redosed.  The third and fourth distal  anastomosis consisted of a sequential vein going to the obtuse marginal 1  and obtuse marginal 2.  The obtuse marginal 1 was a smaller 1.2 mm vessel  with proximal 95% stenosis.  A side-to-side anastomosis with the vein was  sewn with running 7-0 Prolene with good flow through the graft.  The fourth  distal anastomosis was to the larger obtuse marginal 2 which was a 1.75 mm  vessel.  This had a proximal 95% stenosis and the end of the vein was sewn  end-to-side with running 7-0 Prolene with good flow through the graft. Cardioplegia was redosed.  The fifth distal anastomosis was to the distal   mid-LAD.  It was intramyocardial.  It was a 2.0 mm vessel with proximal 90%  stenosis.  The left IMA was brought through an opening in the left lateral  pericardium and was brought down onto the LAD and sewn end-to-side with  running 8-0 Prolene.  There was good flow through the anastomosis with  immediate in rise septal temperature after releasing the pedicle clamp on  the mammary artery.  The mammary pedicle was secured to the epicardium and  the aortic cross-clamp was removed.   The heart resumed a spontaneous rhythm.  Using the partial occlusion  clamp  two proximal vein anastomoses were placed on the ascending aorta with a 4.0  mm punch and running 6-0 Prolene.  The partial clamp was removed and the  vein grafts were perfused.  Each had good flow and hemostasis was documented  in the proximal and distal sites.  The patient was rewarmed to 37 degrees.  Temporary pacing wires were applied.  The lung was re-expanded and the  ventilator was resumed.  The patient was weaned from bypass without  difficulty with stable blood pressure and cardiac output.  Protamine was  administered without adverse reaction. Cannulas were removed.  The  mediastinum was irrigated with warm antibiotic irrigation.  The leg incision  was irrigated and closed in a standard fashion.  The superior pericardium  was closed over the aorta and vein grafts.  Two mediastinal and a left  pleural chest tube were  placed and brought out through separate incisions.  The sternum was closed  with interrupted steel wire. The pectoralis fascia was closed with a running  #1 Vicryl.  The subcutaneous and skin layers were closed with a running  Vicryl and sterile dressings were applied.  Total bypass time was 160  minutes and cross-clamp time of 90 minutes.                                               Mikey Bussing, M.D.    PV/MEDQ  D:  02/16/2004  T:  02/17/2004  Job:  [id number]   cc:   Reinaldo Meeker, M.D.  [address]  [place]  [place] [address]  Fax: [phone]   CVTS office   [facility]

## 2011-01-31 ENCOUNTER — Encounter: Payer: Self-pay | Admitting: Cardiology

## 2011-02-04 ENCOUNTER — Encounter: Payer: Self-pay | Admitting: Cardiology

## 2011-03-18 ENCOUNTER — Encounter: Payer: Self-pay | Admitting: Cardiology

## 2011-03-21 ENCOUNTER — Encounter: Payer: Self-pay | Admitting: Cardiology

## 2011-03-21 DIAGNOSIS — Z951 Presence of aortocoronary bypass graft: Secondary | ICD-10-CM | POA: Insufficient documentation

## 2011-03-21 DIAGNOSIS — E119 Type 2 diabetes mellitus without complications: Secondary | ICD-10-CM | POA: Insufficient documentation

## 2011-03-21 DIAGNOSIS — N289 Disorder of kidney and ureter, unspecified: Secondary | ICD-10-CM | POA: Insufficient documentation

## 2011-03-21 DIAGNOSIS — I739 Peripheral vascular disease, unspecified: Secondary | ICD-10-CM

## 2011-03-22 ENCOUNTER — Encounter: Payer: Self-pay | Admitting: Cardiology

## 2011-03-22 ENCOUNTER — Ambulatory Visit (INDEPENDENT_AMBULATORY_CARE_PROVIDER_SITE_OTHER): Payer: Medicare Other | Admitting: Cardiology

## 2011-03-22 VITALS — BP 122/68 | Ht 70.0 in | Wt 187.0 lb

## 2011-03-22 DIAGNOSIS — L4050 Arthropathic psoriasis, unspecified: Secondary | ICD-10-CM | POA: Insufficient documentation

## 2011-03-22 DIAGNOSIS — I2510 Atherosclerotic heart disease of native coronary artery without angina pectoris: Secondary | ICD-10-CM

## 2011-03-22 DIAGNOSIS — N289 Disorder of kidney and ureter, unspecified: Secondary | ICD-10-CM

## 2011-03-22 DIAGNOSIS — I779 Disorder of arteries and arterioles, unspecified: Secondary | ICD-10-CM

## 2011-03-22 DIAGNOSIS — I10 Essential (primary) hypertension: Secondary | ICD-10-CM

## 2011-03-22 NOTE — Assessment & Plan Note (Signed)
Coronary disease is stable.  He does not need further workup at this time. 

## 2011-03-22 NOTE — Assessment & Plan Note (Signed)
He had severe symptoms from his psoriatic arthritis.  Fortunately he is greatly improved.

## 2011-03-22 NOTE — Assessment & Plan Note (Signed)
Blood pressure is controlled. No change in therapy. 

## 2011-03-22 NOTE — Assessment & Plan Note (Signed)
I reviewed labs and he brought me.  His most recent creatinine is 1.32.  No further workup.

## 2011-03-22 NOTE — Patient Instructions (Signed)
Your physician recommends that you schedule a follow-up appointment in: 1year Your physician has requested that you have a carotid duplex. This test is an ultrasound of the carotid arteries in your neck. It looks at blood flow through these arteries that supply the brain with blood. Allow one hour for this exam. There are no restrictions or special instructions.   

## 2011-03-22 NOTE — Assessment & Plan Note (Signed)
It is now time for followup of his carotid Dopplers.  This will be scheduled.

## 2011-03-22 NOTE — Progress Notes (Signed)
HPI Patient is seen for cardiology followup.  I saw him last July, 2011.  He is doing well.  He's had a problem with psoriatic arthritis.  He is now on methotrexate.  I saw him we arrange for followup her other doctors.  In August 2011 he had 40-50% R. ICA and 0-39% LICA.  He needs one-year followup.  He also had a nuclear scan.  I have reviewed that result.  His ejection fraction was normal at 60%.  There was no ischemia.  When his arthritis pain is stable he is doing very well.  He is active. Allergies  Allergen Reactions  . Sulfa Antibiotics     Current Outpatient Prescriptions  Medication Sig Dispense Refill  . allopurinol (ZYLOPRIM) 300 MG tablet 1 1/2 tablet daily       . aspirin 81 MG tablet Take 81 mg by mouth daily.        . carvedilol (COREG) 25 MG tablet Take 12.5 mg by mouth 2 (two) times daily with a meal.       . Cholecalciferol (VITAMIN D3) 5000 UNITS CAPS Take 1 capsule by mouth daily.        . ferrous sulfate 325 (65 FE) MG tablet Take 325 mg by mouth daily with breakfast.        . finasteride (PROSCAR) 5 MG tablet Take 5 mg by mouth daily.        . folic acid (FOLVITE) 1 MG tablet Take 2 mg by mouth daily.        . hydroxychloroquine (PLAQUENIL) 200 MG tablet Take 200 mg by mouth 2 (two) times daily.        Marland Kitchen lisinopril (PRINIVIL,ZESTRIL) 5 MG tablet Take 2.5 mg by mouth daily.        . metFORMIN (GLUCOPHAGE) 1000 MG tablet Take 1,000 mg by mouth 2 (two) times daily with a meal.        . methotrexate (RHEUMATREX) 2.5 MG tablet Take by mouth once a week. Caution:Chemotherapy. Protect from light.  7 tablets every Wednesday         History   Social History  . Marital Status: Married    Spouse Name: N/A    Number of Children: N/A  . Years of Education: N/A   Occupational History  . Not on file.   Social History Main Topics  . Smoking status: Former Smoker    Types: Cigarettes    Quit date: 03/21/1965  . Smokeless tobacco: Never Used  . Alcohol Use: No  . Drug Use:  Not on file  . Sexually Active: Not on file   Other Topics Concern  . Not on file   Social History Narrative  . No narrative on file    No family history on file.  Past Medical History  Diagnosis Date  . CAD (coronary artery disease)     January, 2012, nuclear, EF 61%, normal  . Renal insufficiency   . Diabetes mellitus   . Hyperlipidemia   . HTN (hypertension), benign   . Pain 02/2010    right neck, right shoulder, right wrist, right leg-not cardiac  . Hx of CABG     2005  . Carotid artery disease     Doppler, August, 2011, 40-59% RIC A., 0-39% LICA, followup one year    Past Surgical History  Procedure Date  . Coronary artery bypass graft 2005    ROS  Patient denies fever, chills, headache, sweats, rash, change in vision, change in hearing, chest pain,  cough, nausea vomiting, urinary symptoms.  All other systems are reviewed and are negative.  PHYSICAL EXAM He is. Stable today.  He is here with his wife.  He is oriented to person time and place.  Affect is normal. Head is atraumatic.  There is no xanthelasma.  There is no jugular venous lungs are clear.  Respiratory effort is nonlabored.  Cardiac exam S1 and S2.  There no clicks or significant murmurs.  The abdomen is soft there is no peripheral edema.  There are no musculoskeletal deformities.  There are no skin rash at this time. Filed Vitals:   03/22/11 0913  BP: 122/68  Height: 5\' 10"  (1.778 m)  Weight: 187 lb (84.823 kg)    EKG  Is done today and reviewed by me.  There is no significant abnormality.  There is no change in the past.  ASSESSMENT & PLAN

## 2011-04-12 ENCOUNTER — Encounter (INDEPENDENT_AMBULATORY_CARE_PROVIDER_SITE_OTHER): Payer: Medicare Other | Admitting: *Deleted

## 2011-04-12 DIAGNOSIS — I6529 Occlusion and stenosis of unspecified carotid artery: Secondary | ICD-10-CM

## 2012-03-25 ENCOUNTER — Encounter: Payer: Self-pay | Admitting: Cardiology

## 2012-03-26 ENCOUNTER — Encounter: Payer: Self-pay | Admitting: Cardiology

## 2012-03-26 ENCOUNTER — Ambulatory Visit (INDEPENDENT_AMBULATORY_CARE_PROVIDER_SITE_OTHER): Payer: Medicare Other | Admitting: Cardiology

## 2012-03-26 VITALS — BP 126/74 | HR 74 | Ht 70.0 in | Wt 187.8 lb

## 2012-03-26 DIAGNOSIS — I2510 Atherosclerotic heart disease of native coronary artery without angina pectoris: Secondary | ICD-10-CM

## 2012-03-26 DIAGNOSIS — I10 Essential (primary) hypertension: Secondary | ICD-10-CM

## 2012-03-26 DIAGNOSIS — I779 Disorder of arteries and arterioles, unspecified: Secondary | ICD-10-CM

## 2012-03-26 NOTE — Assessment & Plan Note (Signed)
Blood pressure stable. No change in therapy. 

## 2012-03-26 NOTE — Assessment & Plan Note (Signed)
Coronary disease is stable. No change in therapy. 

## 2012-03-26 NOTE — Progress Notes (Signed)
HPI   The patient returns for one year followup. He is stable. He's not having any significant chest pain. After I saw him last year carotid Doppler was done in August, 2012. He has moderate disease it is stable. He is fully active. We had done a nuclear study in January, 2012. He had no ischemia at that time.  Allergies  Allergen Reactions  . Sulfa Antibiotics     Current Outpatient Prescriptions  Medication Sig Dispense Refill  . allopurinol (ZYLOPRIM) 300 MG tablet 1 1/2 tablet daily       . aspirin 81 MG tablet Take 81 mg by mouth daily.        . carvedilol (COREG) 25 MG tablet Take 12.5 mg by mouth 2 (two) times daily with a meal.       . Cholecalciferol (VITAMIN D3) 5000 UNITS CAPS Take 1 capsule by mouth daily.        . finasteride (PROSCAR) 5 MG tablet Take 5 mg by mouth daily.        . folic acid (FOLVITE) 1 MG tablet Take 2 mg by mouth daily.        . hydroxychloroquine (PLAQUENIL) 200 MG tablet Take 200 mg by mouth 2 (two) times daily.        Marland Kitchen lisinopril (PRINIVIL,ZESTRIL) 5 MG tablet Take 2.5 mg by mouth daily.        . metFORMIN (GLUCOPHAGE) 1000 MG tablet Take 1,000 mg by mouth 2 (two) times daily with a meal.        . methotrexate (RHEUMATREX) 2.5 MG tablet Take by mouth once a week. Caution:Chemotherapy. Protect from light.  7 tablets every Wednesday         History   Social History  . Marital Status: Married    Spouse Name: N/A    Number of Children: N/A  . Years of Education: N/A   Occupational History  . Not on file.   Social History Main Topics  . Smoking status: Former Smoker    Types: Cigarettes    Quit date: 03/21/1965  . Smokeless tobacco: Never Used  . Alcohol Use: No  . Drug Use: Not on file  . Sexually Active: Not on file   Other Topics Concern  . Not on file   Social History Narrative  . No narrative on file    No family history on file.  Past Medical History  Diagnosis Date  . CAD (coronary artery disease)     January, 2012,  nuclear, EF 61%, normal  . Renal insufficiency   . Diabetes mellitus   . Hyperlipidemia   . HTN (hypertension), benign   . Pain 02/2010    right neck, right shoulder, right wrist, right leg-not cardiac  . Hx of CABG     2005  . Carotid artery disease     Doppler, August, 2011, 40-59% RIC A., 0-39% LICA, followup one year  . Psoriatic arthritis     2012    Past Surgical History  Procedure Date  . Coronary artery bypass graft 2005    ROS  Patient denies fever, chills, headache, sweats, rash, change in vision, change in hearing, chest pain, cough, nausea vomiting, urinary symptoms. All other systems are reviewed and are negative.  PHYSICAL EXAM   Patient is here with his wife. He is oriented to person time and place. Affect is normal. Lungs are clear. Respiratory effort is nonlabored. I do not hear carotid bruits. There is no jugulovenous distention. Cardiac exam  reveals S1 and S2. There are no clicks or significant murmurs. Abdomen is soft. Is no peripheral edema. Filed Vitals:   03/26/12 0903  BP: 126/74  Pulse: 74  Height: 5\' 10"  (1.778 m)  Weight: 187 lb 12.8 oz (85.186 kg)    EKG  EKG is done today and reviewed by me. There is no significant change.  ASSESSMENT & PLAN

## 2012-03-26 NOTE — Assessment & Plan Note (Signed)
Carotid disease is stable. It has not changed significantly over time. He and I discussed this. We will wait another year before doing his carotid Doppler next year. Plan 1 year followup.

## 2012-03-26 NOTE — Patient Instructions (Addendum)
Your physician wants you to follow-up in: 1 year. You will receive a reminder letter in the mail two months in advance. If you don't receive a letter, please call our office to schedule the follow-up appointment.  

## 2012-04-29 ENCOUNTER — Other Ambulatory Visit: Payer: Self-pay | Admitting: Cardiology

## 2012-04-29 DIAGNOSIS — I6529 Occlusion and stenosis of unspecified carotid artery: Secondary | ICD-10-CM

## 2012-05-01 ENCOUNTER — Encounter (INDEPENDENT_AMBULATORY_CARE_PROVIDER_SITE_OTHER): Payer: Medicare Other

## 2012-05-01 DIAGNOSIS — I6529 Occlusion and stenosis of unspecified carotid artery: Secondary | ICD-10-CM

## 2012-05-05 ENCOUNTER — Encounter: Payer: Self-pay | Admitting: Cardiology

## 2013-05-18 ENCOUNTER — Encounter (INDEPENDENT_AMBULATORY_CARE_PROVIDER_SITE_OTHER): Payer: Medicare Other

## 2013-05-18 DIAGNOSIS — I6529 Occlusion and stenosis of unspecified carotid artery: Secondary | ICD-10-CM

## 2013-05-20 ENCOUNTER — Encounter: Payer: Self-pay | Admitting: Cardiology

## 2013-05-27 ENCOUNTER — Encounter: Payer: Self-pay | Admitting: Cardiology

## 2013-05-27 DIAGNOSIS — R9430 Abnormal result of cardiovascular function study, unspecified: Secondary | ICD-10-CM | POA: Insufficient documentation

## 2013-05-28 ENCOUNTER — Encounter: Payer: Self-pay | Admitting: Cardiology

## 2013-05-28 ENCOUNTER — Ambulatory Visit (INDEPENDENT_AMBULATORY_CARE_PROVIDER_SITE_OTHER): Payer: Medicare Other | Admitting: Cardiology

## 2013-05-28 VITALS — BP 150/82 | HR 66 | Ht 70.0 in | Wt 193.8 lb

## 2013-05-28 DIAGNOSIS — I779 Disorder of arteries and arterioles, unspecified: Secondary | ICD-10-CM

## 2013-05-28 DIAGNOSIS — I10 Essential (primary) hypertension: Secondary | ICD-10-CM

## 2013-05-28 DIAGNOSIS — E785 Hyperlipidemia, unspecified: Secondary | ICD-10-CM | POA: Insufficient documentation

## 2013-05-28 DIAGNOSIS — I2510 Atherosclerotic heart disease of native coronary artery without angina pectoris: Secondary | ICD-10-CM

## 2013-05-28 DIAGNOSIS — R52 Pain, unspecified: Secondary | ICD-10-CM

## 2013-05-28 HISTORY — DX: Hyperlipidemia, unspecified: E78.5

## 2013-05-28 MED ORDER — LISINOPRIL 5 MG PO TABS: 5.0000 mg | ORAL_TABLET | Freq: Two times a day (BID) | ORAL | Status: DC

## 2013-05-28 NOTE — Progress Notes (Signed)
HPI  Patient is seen for cardiology followup. He's doing well. I saw him last August, 2013. Since that time he has had a carotid Doppler. He's had some mild progression. I've discussed this with the patient and his wife to make sure that they understand that he is still stable. He does need a six-month followup. He's not having any significant chest pain.  He has been taking one Crestor tablet weekly because he was having joint pain. He thinks he can now try to increase it to 2 per week.  His systolic blood pressure is mildly elevated today.  Allergies  Allergen Reactions  . Sulfa Antibiotics     Current Outpatient Prescriptions  Medication Sig Dispense Refill  . allopurinol (ZYLOPRIM) 300 MG tablet 1 1/2 tablet daily       . aspirin 81 MG tablet Take 81 mg by mouth daily.        . carvedilol (COREG) 25 MG tablet Take 12.5 mg by mouth 2 (two) times daily with a meal.       . Cholecalciferol (VITAMIN D3) 5000 UNITS CAPS Take 1 capsule by mouth daily.        . finasteride (PROSCAR) 5 MG tablet Take 5 mg by mouth daily.        . folic acid (FOLVITE) 1 MG tablet Take 2 mg by mouth daily.        . hydroxychloroquine (PLAQUENIL) 200 MG tablet Take 200 mg by mouth 2 (two) times daily.        Marland Kitchen lisinopril (PRINIVIL,ZESTRIL) 5 MG tablet Take 2.5 mg by mouth daily.        . metFORMIN (GLUCOPHAGE) 1000 MG tablet Take 1,000 mg by mouth 2 (two) times daily with a meal.        . methotrexate (RHEUMATREX) 2.5 MG tablet Take by mouth once a week. Caution:Chemotherapy. Protect from light.  7 tablets every Wednesday       . rosuvastatin (CRESTOR) 10 MG tablet Take 10 mg by mouth once a week.      . terbinafine (LAMISIL) 250 MG tablet        No current facility-administered medications for this visit.    History   Social History  . Marital Status: Married    Spouse Name: N/A    Number of Children: N/A  . Years of Education: N/A   Occupational History  . Not on file.   Social History Main  Topics  . Smoking status: Former Smoker    Types: Cigarettes    Quit date: 03/21/1965  . Smokeless tobacco: Never Used  . Alcohol Use: No  . Drug Use: Not on file  . Sexual Activity: Not on file   Other Topics Concern  . Not on file   Social History Narrative  . No narrative on file    No family history on file.  Past Medical History  Diagnosis Date  . CAD (coronary artery disease)     January, 2012, nuclear, EF 61%, normal  . Renal insufficiency   . Diabetes mellitus   . Hyperlipidemia   . HTN (hypertension), benign   . Pain 02/2010    right neck, right shoulder, right wrist, right leg-not cardiac  . Hx of CABG     2005  . Carotid artery disease     Doppler, August, 2011, 40-59% RIC A., 0-39% LICA, followup one year  . Psoriatic arthritis     2012  . Ejection fraction     Past Surgical  History  Procedure Laterality Date  . Coronary artery bypass graft  2005    Patient Active Problem List   Diagnosis Date Noted  . Ejection fraction   . Psoriatic arthritis   . CAD (coronary artery disease)   . Renal insufficiency   . Diabetes mellitus   . HTN (hypertension), benign   . Hx of CABG   . Carotid artery disease   . Pain 02/16/2010    ROS   Patient denies fever, chills, headache, sweats, rash, change in vision, change in hearing, chest pain, cough, nausea vomiting, urinary symptoms. All other systems are reviewed and are negative.  PHYSICAL EXAM  Patient is oriented to person time and place. Affect is normal. He's here with his wife today. There is no jugulovenous distention. Lungs are clear. Respiratory effort is nonlabored. Cardiac exam reveals S1 and S2. There no clicks or significant murmurs. The abdomen is soft. There is no peripheral edema. There no musculoskeletal deformities. There are no skin rashes.  Filed Vitals:   05/28/13 0855  BP: 150/82  Pulse: 66  Height: 5\' 10"  (1.778 m)  Weight: 193 lb 12.8 oz (87.907 kg)   EKG is done today and reviewed  by me. I have also compared to his old tracing. There sinus rhythm. There is one PVC noted. There is no significant change from the past.  ASSESSMENT & PLAN

## 2013-05-28 NOTE — Assessment & Plan Note (Signed)
The patient's systolic blood pressure is elevated today. His lisinopril dose is very small. He will be increased to 5 mg twice a day.

## 2013-05-28 NOTE — Assessment & Plan Note (Signed)
He has had some progression of his carotid disease. He has a 6 month followup schedule. I discussed this fully with the patient and his wife.

## 2013-05-28 NOTE — Assessment & Plan Note (Signed)
Coronary disease is stable. He does not need any further testing at this time.  As part of today's evaluation I spent greater than 25 minutes with the patient's overall care. More than half of this time spent with direct contact talking with the patient and his wife and adjusting his meds and explaining all of his findings related to his carotids

## 2013-05-28 NOTE — Assessment & Plan Note (Signed)
He tells me he is taking Crestor one pill per week. He is in agreement that he will try to increase this to 2 pills per week. Hopefully his dose can be titrated back up now that his arthritic pain is being treated other matters.

## 2013-05-28 NOTE — Assessment & Plan Note (Signed)
The patient's arthritic pain is under much better control. He follows with his rheumatologist.

## 2013-05-28 NOTE — Patient Instructions (Signed)
Your physician has recommended you make the following change in your medication: increase Lisinopril to 5 mg twice daily  Your physician wants you to follow-up in: 6 months. You will receive a reminder letter in the mail two months in advance. If you don't receive a letter, please call our office to schedule the follow-up appointment.

## 2013-11-26 ENCOUNTER — Encounter: Payer: Self-pay | Admitting: Cardiology

## 2013-11-26 ENCOUNTER — Ambulatory Visit (INDEPENDENT_AMBULATORY_CARE_PROVIDER_SITE_OTHER): Payer: Commercial Managed Care - HMO | Admitting: Cardiology

## 2013-11-26 ENCOUNTER — Ambulatory Visit (HOSPITAL_COMMUNITY): Payer: Medicare HMO | Attending: Cardiology | Admitting: Cardiology

## 2013-11-26 VITALS — BP 112/64 | HR 78 | Ht 70.0 in | Wt 192.0 lb

## 2013-11-26 DIAGNOSIS — I10 Essential (primary) hypertension: Secondary | ICD-10-CM

## 2013-11-26 DIAGNOSIS — I2510 Atherosclerotic heart disease of native coronary artery without angina pectoris: Secondary | ICD-10-CM

## 2013-11-26 DIAGNOSIS — I779 Disorder of arteries and arterioles, unspecified: Secondary | ICD-10-CM

## 2013-11-26 DIAGNOSIS — I739 Peripheral vascular disease, unspecified: Secondary | ICD-10-CM

## 2013-11-26 DIAGNOSIS — I6529 Occlusion and stenosis of unspecified carotid artery: Secondary | ICD-10-CM

## 2013-11-26 DIAGNOSIS — E785 Hyperlipidemia, unspecified: Secondary | ICD-10-CM

## 2013-11-26 NOTE — Assessment & Plan Note (Signed)
His blood pressure meds was adjusted recently by Dr. Nicki Reaper. His blood pressure looks good today. No change in therapy.

## 2013-11-26 NOTE — Assessment & Plan Note (Signed)
Coronary disease is stable. He had a nuclear study in 20 one to. He needs no further workup at this time.

## 2013-11-26 NOTE — Patient Instructions (Signed)
**Note De-identified  Obfuscation** Your physician recommends that you continue on your current medications as directed. Please refer to the Current Medication list given to you today.  Your physician wants you to follow-up in: 1 year. You will receive a reminder letter in the mail two months in advance. If you don't receive a letter, please call our office to schedule the follow-up appointment.  

## 2013-11-26 NOTE — Progress Notes (Signed)
Patient ID: Dan Holmes, male   DOB: [birth year], 80 y.o.   MRN: [id number]    HPI  Patient is here to followup coronary disease. He really looks quite good. He will be having a followup Doppler later today because he had some progression of his prior Doppler. He is able to tolerate Crestor once per week. Otherwise he gets significant joint pains. He's not having any significant chest pain.  Allergies  Allergen Reactions  . Sulfa Antibiotics     Current Outpatient Prescriptions  Medication Sig Dispense Refill  . allopurinol (ZYLOPRIM) 300 MG tablet Take 1 tablet daily      . aspirin 81 MG tablet Take 81 mg by mouth daily.        . carvedilol (COREG) 25 MG tablet Take 12.5 mg by mouth 2 (two) times daily with a meal.       . Cholecalciferol (VITAMIN D3) 5000 UNITS CAPS Take 1 capsule by mouth daily.        . finasteride (PROSCAR) 5 MG tablet Take 5 mg by mouth daily.        . folic acid (FOLVITE) 1 MG tablet Take 2 mg by mouth daily.        Marland Kitchen lisinopril (PRINIVIL,ZESTRIL) 20 MG tablet Take 20 mg by mouth daily.      . metFORMIN (GLUCOPHAGE) 1000 MG tablet Take 1,000 mg by mouth 2 (two) times daily with a meal.        . methotrexate (RHEUMATREX) 2.5 MG tablet Take 10 mg by mouth once a week. Caution:Chemotherapy. Protect from light.  7 tablets every Wednesday      . rosuvastatin (CRESTOR) 10 MG tablet Take 10 mg by mouth once a week.       No current facility-administered medications for this visit.    History   Social History  . Marital Status: Married    Spouse Name: N/A    Number of Children: N/A  . Years of Education: N/A   Occupational History  . Not on file.   Social History Main Topics  . Smoking status: Former Smoker    Types: Cigarettes    Quit date: 03/21/1965  . Smokeless tobacco: Never Used  . Alcohol Use: No  . Drug Use: Not on file  . Sexual Activity: Not on file   Other Topics Concern  . Not on file   Social History Narrative  . No narrative on  file    No family history on file.  Past Medical History  Diagnosis Date  . CAD (coronary artery disease)     January, 2012, nuclear, EF 61%, normal  . Renal insufficiency   . Diabetes mellitus   . Hyperlipidemia   . HTN (hypertension), benign   . Pain 02/2010    right neck, right shoulder, right wrist, right leg-not cardiac  . Hx of CABG     2005  . Carotid artery disease     Doppler, August, 2011, 58-52% RIC A., 7-78% LICA, followup one year  . Psoriatic arthritis     2012  . Ejection fraction     Past Surgical History  Procedure Laterality Date  . Coronary artery bypass graft  2005    Patient Active Problem List   Diagnosis Date Noted  . Dyslipidemia 05/28/2013  . Ejection fraction   . Psoriatic arthritis   . CAD (coronary artery disease)   . Renal insufficiency   . Diabetes mellitus   . HTN (hypertension), benign   . Hx  of CABG   . Carotid artery disease   . Pain 02/16/2010    ROS   Patient denies fever, chills, headache, sweats, rash, change in vision, change in hearing, chest pain, cough, nausea vomiting, urinary symptoms. All other systems are reviewed and are negative.  PHYSICAL EXAM  Patient looks quite good. He is here with his wife. He is oriented to person time and place. Affect is normal. There is no jugular venous distention. Lungs are clear. Respiratory effort is nonlabored. Cardiac exam reveals S1 and S2. There no clicks or significant murmurs. The abdomen is soft. There is no peripheral edema.  Filed Vitals:   11/26/13 0754  BP: 112/64  Pulse: 78  Height: 5\' 10"  (1.778 m)  Weight: 192 lb (87.091 kg)     ASSESSMENT & PLAN

## 2013-11-26 NOTE — Assessment & Plan Note (Signed)
The patient had mild progression of his carotid disease in September, 2014. Therefore he is having a six-month followup today.

## 2013-11-26 NOTE — Progress Notes (Signed)
Carotid duplex completed 

## 2013-11-26 NOTE — Assessment & Plan Note (Signed)
He takes Crestor one day per week. This is all he can tolerate.

## 2013-12-05 ENCOUNTER — Encounter: Payer: Self-pay | Admitting: Cardiology

## 2013-12-06 ENCOUNTER — Other Ambulatory Visit: Payer: Self-pay

## 2013-12-06 DIAGNOSIS — I779 Disorder of arteries and arterioles, unspecified: Secondary | ICD-10-CM

## 2014-09-13 DIAGNOSIS — L4050 Arthropathic psoriasis, unspecified: Secondary | ICD-10-CM | POA: Diagnosis not present

## 2014-09-13 DIAGNOSIS — M1A00X0 Idiopathic chronic gout, unspecified site, without tophus (tophi): Secondary | ICD-10-CM | POA: Diagnosis not present

## 2014-09-13 DIAGNOSIS — M810 Age-related osteoporosis without current pathological fracture: Secondary | ICD-10-CM | POA: Diagnosis not present

## 2014-09-13 DIAGNOSIS — Z79899 Other long term (current) drug therapy: Secondary | ICD-10-CM | POA: Diagnosis not present

## 2014-10-17 DIAGNOSIS — E1159 Type 2 diabetes mellitus with other circulatory complications: Secondary | ICD-10-CM | POA: Diagnosis not present

## 2014-10-24 DIAGNOSIS — R05 Cough: Secondary | ICD-10-CM | POA: Diagnosis not present

## 2014-10-24 DIAGNOSIS — E1159 Type 2 diabetes mellitus with other circulatory complications: Secondary | ICD-10-CM | POA: Diagnosis not present

## 2014-10-24 DIAGNOSIS — Z0000 Encounter for general adult medical examination without abnormal findings: Secondary | ICD-10-CM | POA: Diagnosis not present

## 2014-10-24 DIAGNOSIS — T464X5A Adverse effect of angiotensin-converting-enzyme inhibitors, initial encounter: Secondary | ICD-10-CM | POA: Diagnosis not present

## 2014-10-24 DIAGNOSIS — E1165 Type 2 diabetes mellitus with hyperglycemia: Secondary | ICD-10-CM | POA: Diagnosis not present

## 2014-12-15 DIAGNOSIS — E119 Type 2 diabetes mellitus without complications: Secondary | ICD-10-CM | POA: Diagnosis not present

## 2014-12-16 ENCOUNTER — Encounter: Payer: Self-pay | Admitting: Cardiology

## 2014-12-16 ENCOUNTER — Ambulatory Visit (INDEPENDENT_AMBULATORY_CARE_PROVIDER_SITE_OTHER): Payer: Commercial Managed Care - HMO | Admitting: Cardiology

## 2014-12-16 VITALS — BP 182/94 | HR 65 | Ht 70.0 in | Wt 189.8 lb

## 2014-12-16 DIAGNOSIS — T464X5A Adverse effect of angiotensin-converting-enzyme inhibitors, initial encounter: Secondary | ICD-10-CM

## 2014-12-16 DIAGNOSIS — I25810 Atherosclerosis of coronary artery bypass graft(s) without angina pectoris: Secondary | ICD-10-CM

## 2014-12-16 DIAGNOSIS — I10 Essential (primary) hypertension: Secondary | ICD-10-CM

## 2014-12-16 DIAGNOSIS — R05 Cough: Secondary | ICD-10-CM | POA: Diagnosis not present

## 2014-12-16 DIAGNOSIS — R058 Other specified cough: Secondary | ICD-10-CM | POA: Insufficient documentation

## 2014-12-16 DIAGNOSIS — I779 Disorder of arteries and arterioles, unspecified: Secondary | ICD-10-CM

## 2014-12-16 DIAGNOSIS — E785 Hyperlipidemia, unspecified: Secondary | ICD-10-CM | POA: Diagnosis not present

## 2014-12-16 DIAGNOSIS — I739 Peripheral vascular disease, unspecified: Secondary | ICD-10-CM

## 2014-12-16 HISTORY — DX: Adverse effect of angiotensin-converting-enzyme inhibitors, initial encounter: T46.4X5A

## 2014-12-16 NOTE — Assessment & Plan Note (Signed)
Coronary disease is stable.  He needs no further workup at this time. 

## 2014-12-16 NOTE — Progress Notes (Signed)
Cardiology Office Note   Date:  12/16/2014   ID:  Dan Holmes, DOB June 10, 1932, MRN [id number]  PCP:  Ann Held, MD  Cardiologist:  Dola Argyle, MD   Chief Complaint  Patient presents with  . Appointment    Follow-up coronary artery disease      History of Present Illness: Dan Holmes is a 81 y.o. male who presents today to follow up coronary disease. I have recent records from Dr. Bary Leriche office. The patient has not been having any significant chest pain. He has been having a cough. His ACE inhibitor was changed to an ARB. The patient feels that his cough is somewhat improved with this. Today his blood pressure is elevated. He does not check it at home. He does have early follow-up arranged with Dr. Nicki Reaper.  The patient has some intermittent left upper chest discomfort. It usually occurs after he has exercised using his arms. It sounds musculoskeletal. When he walks he has no symptoms. Historically he did have symptoms with exercise before his bypass surgery.  His last carotid Doppler was April, 2015. Six-month follow-up was recommended. This has not been done. He does need follow-up at this time.    Past Medical History  Diagnosis Date  . CAD (coronary artery disease)     January, 2012, nuclear, EF 61%, normal  . Renal insufficiency   . Diabetes mellitus   . Hyperlipidemia   . HTN (hypertension), benign   . Pain 02/2010    right neck, right shoulder, right wrist, right leg-not cardiac  . Hx of CABG     2005  . Carotid artery disease     Doppler, August, 2011, 89-21% RIC A., 1-94% LICA, followup one year  . Psoriatic arthritis     2012  . Ejection fraction   . Gout   . Arthritis   . ASCVD (arteriosclerotic cardiovascular disease)     Past Surgical History  Procedure Laterality Date  . Coronary artery bypass graft  2005  . Spinal fusion    . Coronary artery bypass graft      Patient Active Problem List   Diagnosis Date Noted  . Dyslipidemia  05/28/2013  . Ejection fraction   . Psoriatic arthritis   . CAD (coronary artery disease)   . Renal insufficiency   . Diabetes mellitus   . HTN (hypertension), benign   . Hx of CABG   . Carotid artery disease   . Pain 02/16/2010      Current Outpatient Prescriptions  Medication Sig Dispense Refill  . allopurinol (ZYLOPRIM) 300 MG tablet Take 1 tablet daily    . aspirin 81 MG tablet Take 81 mg by mouth daily.      . carvedilol (COREG) 25 MG tablet Take 12.5 mg by mouth 2 (two) times daily with a meal.     . Cholecalciferol (VITAMIN D3) 5000 UNITS CAPS Take 1 capsule by mouth daily.      . finasteride (PROSCAR) 5 MG tablet Take 5 mg by mouth daily.      . folic acid (FOLVITE) 1 MG tablet Take 2 mg by mouth daily.      . metFORMIN (GLUCOPHAGE) 1000 MG tablet Take 1,000 mg by mouth 2 (two) times daily with a meal.      . methotrexate (RHEUMATREX) 2.5 MG tablet Take 10 mg by mouth once a week. Caution:Chemotherapy. Protect from light.  7 tablets every Wednesday    . rosuvastatin (CRESTOR) 10 MG tablet Take 10 mg by  mouth once a week.    . valsartan (DIOVAN) 160 MG tablet Take 160 mg by mouth daily.      No current facility-administered medications for this visit.    Allergies:   Sulfa antibiotics    Social History:  The patient  reports that he quit smoking about 49 years ago. His smoking use included Cigarettes. He has never used smokeless tobacco. He reports that he does not drink alcohol.   Family History:  The patient's family history includes CVA in his mother; Heart attack in his father.    ROS:  Please see the history of present illness.     Patient denies fever, chills, headache, sweats, rash, change in vision, change in hearing, nausea or vomiting, urinary symptoms. All other systems are reviewed and are negative.   PHYSICAL EXAM: VS:  BP 182/94 mmHg  Pulse 65  Ht 5\' 10"  (1.778 m)  Wt 189 lb 12.8 oz (86.093 kg)  BMI 27.23 kg/m2 , Patient is oriented to person time  and place. Affect is normal. He is here with his wife. Head is atraumatic. Sclera and conjunctiva are normal. There is no jugular venous distention. Lungs are clear. Respiratory effort is nonlabored. Cardiac exam reveals an S1 and S2. The abdomen is soft. There is no peripheral edema. There are no musculoskeletal deformities. There are no skin rashes. Neurologic is grossly intact.  EKG:   EKG reveals normal sinus rhythm. There is no significant change.   Recent Labs: No results found for requested labs within last 365 days.    Lipid Panel No results found for: CHOL, TRIG, HDL, CHOLHDL, VLDL, LDLCALC, LDLDIRECT    Wt Readings from Last 3 Encounters:  12/16/14 189 lb 12.8 oz (86.093 kg)  11/26/13 192 lb (87.091 kg)  05/28/13 193 lb 12.8 oz (87.907 kg)      Current medicines are reviewed  The patient understands his medications.     ASSESSMENT AND PLAN:

## 2014-12-16 NOTE — Assessment & Plan Note (Signed)
He feels that his cough is somewhat improved since he was changed from an Ace to an ARB.

## 2014-12-16 NOTE — Assessment & Plan Note (Signed)
He is taking guideline directed therapy.

## 2014-12-16 NOTE — Assessment & Plan Note (Signed)
Blood pressure is elevated today. He will be returning to see his primary physician soon for further adjustment in his medications.

## 2014-12-16 NOTE — Patient Instructions (Signed)
Medication Instructions:  None  Labwork: None  Testing/Procedures: Your physician has requested that you have a carotid duplex. This test is an ultrasound of the carotid arteries in your neck. It looks at blood flow through these arteries that supply the brain with blood. Allow one hour for this exam. There are no restrictions or special instructions.   Follow-Up: Your physician wants you to follow-up in: 6 months. You will receive a reminder letter in the mail two months in advance. If you don't receive a letter, please call our office to schedule the follow-up appointment.

## 2014-12-16 NOTE — Assessment & Plan Note (Signed)
The patient's last Doppler was April, 2015. He had stable 60-79% R ICA. There was some progression in the LICA at 72-90%. Recommendation at that time was for 6 month follow-up. This has not been done and the patient is stable. We will arrange now for a one-year follow-up.

## 2014-12-27 ENCOUNTER — Other Ambulatory Visit (HOSPITAL_COMMUNITY): Payer: Commercial Managed Care - HMO

## 2014-12-29 ENCOUNTER — Encounter (HOSPITAL_COMMUNITY): Payer: Commercial Managed Care - HMO

## 2015-01-03 ENCOUNTER — Ambulatory Visit (HOSPITAL_COMMUNITY): Payer: Commercial Managed Care - HMO | Attending: Cardiology

## 2015-01-03 DIAGNOSIS — I739 Peripheral vascular disease, unspecified: Secondary | ICD-10-CM

## 2015-01-03 DIAGNOSIS — I779 Disorder of arteries and arterioles, unspecified: Secondary | ICD-10-CM

## 2015-01-03 DIAGNOSIS — I6523 Occlusion and stenosis of bilateral carotid arteries: Secondary | ICD-10-CM | POA: Insufficient documentation

## 2015-01-18 DIAGNOSIS — E1159 Type 2 diabetes mellitus with other circulatory complications: Secondary | ICD-10-CM | POA: Diagnosis not present

## 2015-01-25 DIAGNOSIS — Z760 Encounter for issue of repeat prescription: Secondary | ICD-10-CM | POA: Diagnosis not present

## 2015-01-25 DIAGNOSIS — E1165 Type 2 diabetes mellitus with hyperglycemia: Secondary | ICD-10-CM | POA: Diagnosis not present

## 2015-01-25 DIAGNOSIS — E1159 Type 2 diabetes mellitus with other circulatory complications: Secondary | ICD-10-CM | POA: Diagnosis not present

## 2015-02-16 DIAGNOSIS — M810 Age-related osteoporosis without current pathological fracture: Secondary | ICD-10-CM | POA: Diagnosis not present

## 2015-02-16 DIAGNOSIS — L4050 Arthropathic psoriasis, unspecified: Secondary | ICD-10-CM | POA: Diagnosis not present

## 2015-02-16 DIAGNOSIS — M1A00X0 Idiopathic chronic gout, unspecified site, without tophus (tophi): Secondary | ICD-10-CM | POA: Diagnosis not present

## 2015-02-16 DIAGNOSIS — Z09 Encounter for follow-up examination after completed treatment for conditions other than malignant neoplasm: Secondary | ICD-10-CM | POA: Diagnosis not present

## 2015-03-02 DIAGNOSIS — J40 Bronchitis, not specified as acute or chronic: Secondary | ICD-10-CM | POA: Diagnosis not present

## 2015-03-02 DIAGNOSIS — N200 Calculus of kidney: Secondary | ICD-10-CM | POA: Diagnosis not present

## 2015-04-04 DIAGNOSIS — Z79899 Other long term (current) drug therapy: Secondary | ICD-10-CM | POA: Diagnosis not present

## 2015-06-19 ENCOUNTER — Telehealth: Payer: Self-pay | Admitting: Cardiology

## 2015-06-19 DIAGNOSIS — Z79899 Other long term (current) drug therapy: Secondary | ICD-10-CM | POA: Diagnosis not present

## 2015-06-19 NOTE — Telephone Encounter (Signed)
New message  10.31.2016 @ 3:36 pm call patient to set him up an appt from the recall list with another Physician- pt decided to see another cardiologist in [place]  due to age, getting someone to drive him to [place],. Dan Holmes

## 2015-06-28 DIAGNOSIS — I2510 Atherosclerotic heart disease of native coronary artery without angina pectoris: Secondary | ICD-10-CM

## 2015-06-28 DIAGNOSIS — Z23 Encounter for immunization: Secondary | ICD-10-CM | POA: Diagnosis not present

## 2015-06-28 DIAGNOSIS — E785 Hyperlipidemia, unspecified: Secondary | ICD-10-CM | POA: Insufficient documentation

## 2015-06-28 DIAGNOSIS — I10 Essential (primary) hypertension: Secondary | ICD-10-CM

## 2015-06-28 HISTORY — DX: Atherosclerotic heart disease of native coronary artery without angina pectoris: I25.10

## 2015-06-28 HISTORY — DX: Essential (primary) hypertension: I10

## 2015-06-29 DIAGNOSIS — E785 Hyperlipidemia, unspecified: Secondary | ICD-10-CM | POA: Diagnosis not present

## 2015-06-29 DIAGNOSIS — I10 Essential (primary) hypertension: Secondary | ICD-10-CM | POA: Diagnosis not present

## 2015-06-29 DIAGNOSIS — I2510 Atherosclerotic heart disease of native coronary artery without angina pectoris: Secondary | ICD-10-CM | POA: Diagnosis not present

## 2015-07-28 DIAGNOSIS — E1159 Type 2 diabetes mellitus with other circulatory complications: Secondary | ICD-10-CM | POA: Diagnosis not present

## 2015-08-03 DIAGNOSIS — R809 Proteinuria, unspecified: Secondary | ICD-10-CM | POA: Diagnosis not present

## 2015-08-03 DIAGNOSIS — E1151 Type 2 diabetes mellitus with diabetic peripheral angiopathy without gangrene: Secondary | ICD-10-CM | POA: Diagnosis not present

## 2015-08-03 DIAGNOSIS — E1129 Type 2 diabetes mellitus with other diabetic kidney complication: Secondary | ICD-10-CM | POA: Diagnosis not present

## 2015-09-06 DIAGNOSIS — I10 Essential (primary) hypertension: Secondary | ICD-10-CM | POA: Diagnosis not present

## 2015-09-06 DIAGNOSIS — I2510 Atherosclerotic heart disease of native coronary artery without angina pectoris: Secondary | ICD-10-CM | POA: Diagnosis not present

## 2015-09-06 DIAGNOSIS — E785 Hyperlipidemia, unspecified: Secondary | ICD-10-CM | POA: Diagnosis not present

## 2015-09-21 DIAGNOSIS — L400 Psoriasis vulgaris: Secondary | ICD-10-CM | POA: Diagnosis not present

## 2015-09-21 DIAGNOSIS — M810 Age-related osteoporosis without current pathological fracture: Secondary | ICD-10-CM | POA: Diagnosis not present

## 2015-09-21 DIAGNOSIS — Z79899 Other long term (current) drug therapy: Secondary | ICD-10-CM | POA: Diagnosis not present

## 2015-09-21 DIAGNOSIS — M79641 Pain in right hand: Secondary | ICD-10-CM | POA: Diagnosis not present

## 2015-09-22 DIAGNOSIS — Z79899 Other long term (current) drug therapy: Secondary | ICD-10-CM | POA: Diagnosis not present

## 2015-09-22 DIAGNOSIS — M2550 Pain in unspecified joint: Secondary | ICD-10-CM | POA: Diagnosis not present

## 2015-12-18 DIAGNOSIS — E119 Type 2 diabetes mellitus without complications: Secondary | ICD-10-CM | POA: Diagnosis not present

## 2015-12-18 DIAGNOSIS — H2703 Aphakia, bilateral: Secondary | ICD-10-CM | POA: Diagnosis not present

## 2016-01-27 ENCOUNTER — Emergency Department (HOSPITAL_COMMUNITY): Payer: Commercial Managed Care - HMO

## 2016-01-27 ENCOUNTER — Observation Stay (HOSPITAL_COMMUNITY)
Admission: EM | Admit: 2016-01-27 | Discharge: 2016-01-29 | Disposition: A | Discharge status: Home / Self Care | Payer: Commercial Managed Care - HMO | Attending: Family Medicine | Admitting: Family Medicine

## 2016-01-27 ENCOUNTER — Encounter (HOSPITAL_COMMUNITY): Payer: Self-pay

## 2016-01-27 DIAGNOSIS — N189 Chronic kidney disease, unspecified: Secondary | ICD-10-CM | POA: Diagnosis not present

## 2016-01-27 DIAGNOSIS — I2510 Atherosclerotic heart disease of native coronary artery without angina pectoris: Secondary | ICD-10-CM | POA: Diagnosis not present

## 2016-01-27 DIAGNOSIS — R109 Unspecified abdominal pain: Secondary | ICD-10-CM

## 2016-01-27 DIAGNOSIS — R0789 Other chest pain: Principal | ICD-10-CM | POA: Insufficient documentation

## 2016-01-27 DIAGNOSIS — N289 Disorder of kidney and ureter, unspecified: Secondary | ICD-10-CM | POA: Diagnosis not present

## 2016-01-27 DIAGNOSIS — Z7982 Long term (current) use of aspirin: Secondary | ICD-10-CM | POA: Diagnosis not present

## 2016-01-27 DIAGNOSIS — R197 Diarrhea, unspecified: Secondary | ICD-10-CM | POA: Diagnosis not present

## 2016-01-27 DIAGNOSIS — M1990 Unspecified osteoarthritis, unspecified site: Secondary | ICD-10-CM | POA: Insufficient documentation

## 2016-01-27 DIAGNOSIS — I129 Hypertensive chronic kidney disease with stage 1 through stage 4 chronic kidney disease, or unspecified chronic kidney disease: Secondary | ICD-10-CM | POA: Diagnosis not present

## 2016-01-27 DIAGNOSIS — K859 Acute pancreatitis without necrosis or infection, unspecified: Secondary | ICD-10-CM | POA: Diagnosis present

## 2016-01-27 DIAGNOSIS — Z79899 Other long term (current) drug therapy: Secondary | ICD-10-CM | POA: Diagnosis not present

## 2016-01-27 DIAGNOSIS — R945 Abnormal results of liver function studies: Secondary | ICD-10-CM | POA: Insufficient documentation

## 2016-01-27 DIAGNOSIS — R079 Chest pain, unspecified: Secondary | ICD-10-CM | POA: Diagnosis not present

## 2016-01-27 DIAGNOSIS — E119 Type 2 diabetes mellitus without complications: Secondary | ICD-10-CM | POA: Insufficient documentation

## 2016-01-27 DIAGNOSIS — E785 Hyperlipidemia, unspecified: Secondary | ICD-10-CM | POA: Insufficient documentation

## 2016-01-27 DIAGNOSIS — Z87891 Personal history of nicotine dependence: Secondary | ICD-10-CM | POA: Diagnosis not present

## 2016-01-27 DIAGNOSIS — Z7984 Long term (current) use of oral hypoglycemic drugs: Secondary | ICD-10-CM | POA: Insufficient documentation

## 2016-01-27 DIAGNOSIS — R7989 Other specified abnormal findings of blood chemistry: Secondary | ICD-10-CM | POA: Insufficient documentation

## 2016-01-27 DIAGNOSIS — K769 Liver disease, unspecified: Secondary | ICD-10-CM | POA: Diagnosis not present

## 2016-01-27 HISTORY — DX: Acute pancreatitis without necrosis or infection, unspecified: K85.90

## 2016-01-27 HISTORY — DX: Unspecified abdominal pain: R10.9

## 2016-01-27 LAB — HEPATIC FUNCTION PANEL
ALBUMIN: 4 g/dL (ref 3.5–5.0)
ALK PHOS: 158 U/L — AB (ref 38–126)
ALT: 308 U/L — AB (ref 17–63)
AST: 317 U/L — AB (ref 15–41)
BILIRUBIN TOTAL: 5.6 mg/dL — AB (ref 0.3–1.2)
Bilirubin, Direct: 3.3 mg/dL — ABNORMAL HIGH (ref 0.1–0.5)
Indirect Bilirubin: 2.3 mg/dL — ABNORMAL HIGH (ref 0.3–0.9)
Total Protein: 6.8 g/dL (ref 6.5–8.1)

## 2016-01-27 LAB — CBC
HEMATOCRIT: 38.3 % — AB (ref 39.0–52.0)
HEMOGLOBIN: 12.4 g/dL — AB (ref 13.0–17.0)
MCH: 28.4 pg (ref 26.0–34.0)
MCHC: 32.4 g/dL (ref 30.0–36.0)
MCV: 87.8 fL (ref 78.0–100.0)
Platelets: 181 10*3/uL (ref 150–400)
RBC: 4.36 MIL/uL (ref 4.22–5.81)
RDW: 15.3 % (ref 11.5–15.5)
WBC: 18.2 10*3/uL — AB (ref 4.0–10.5)

## 2016-01-27 LAB — AMYLASE: Amylase: 444 U/L — ABNORMAL HIGH (ref 28–100)

## 2016-01-27 LAB — BASIC METABOLIC PANEL
ANION GAP: 10 (ref 5–15)
BUN: 33 mg/dL — AB (ref 6–20)
CHLORIDE: 98 mmol/L — AB (ref 101–111)
CO2: 28 mmol/L (ref 22–32)
Calcium: 9.4 mg/dL (ref 8.9–10.3)
Creatinine, Ser: 2.32 mg/dL — ABNORMAL HIGH (ref 0.61–1.24)
GFR, EST AFRICAN AMERICAN: 29 mL/min — AB (ref >60)
GFR, EST NON AFRICAN AMERICAN: 25 mL/min — AB (ref >60)
Glucose, Bld: 145 mg/dL — ABNORMAL HIGH (ref 65–99)
POTASSIUM: 4.2 mmol/L (ref 3.5–5.1)
SODIUM: 136 mmol/L (ref 135–145)

## 2016-01-27 LAB — I-STAT TROPONIN, ED: Troponin i, poc: 0.01 ng/mL (ref 0.00–0.08)

## 2016-01-27 LAB — LIPASE, BLOOD: Lipase: 558 U/L — ABNORMAL HIGH (ref 11–51)

## 2016-01-27 MED ORDER — HEPARIN SODIUM (PORCINE) 5000 UNIT/ML IJ SOLN
5000.0000 [IU] | Freq: Three times a day (TID) | INTRAMUSCULAR | Status: DC
  Administered 2016-01-27 – 2016-01-29 (×4): 5000 [IU] via SUBCUTANEOUS
  Filled 2016-01-27 (×2): qty 1

## 2016-01-27 MED ORDER — CARVEDILOL 25 MG PO TABS: 25.0000 mg | ORAL_TABLET | Freq: Two times a day (BID) | ORAL | Status: DC

## 2016-01-27 MED ORDER — ALLOPURINOL 300 MG PO TABS
300.0000 mg | ORAL_TABLET | Freq: Every evening | ORAL | Status: DC
  Administered 2016-01-27 – 2016-01-29 (×3): 300 mg via ORAL
  Filled 2016-01-27 (×3): qty 1

## 2016-01-27 MED ORDER — MORPHINE SULFATE (PF) 2 MG/ML IV SOLN: 2.0000 mg | INTRAVENOUS | Status: DC | PRN

## 2016-01-27 MED ORDER — SODIUM CHLORIDE 0.9 % IV SOLN
INTRAVENOUS | Status: DC
  Administered 2016-01-27 – 2016-01-29 (×2): via INTRAVENOUS

## 2016-01-27 MED ORDER — MORPHINE SULFATE (PF) 4 MG/ML IV SOLN
4.0000 mg | Freq: Once | INTRAVENOUS | Status: AC
  Administered 2016-01-27: 4 mg via INTRAVENOUS
  Filled 2016-01-27: qty 1

## 2016-01-27 MED ORDER — SODIUM CHLORIDE 0.9% FLUSH
3.0000 mL | Freq: Two times a day (BID) | INTRAVENOUS | Status: DC
  Administered 2016-01-27: 3 mL via INTRAVENOUS

## 2016-01-27 MED ORDER — SODIUM CHLORIDE 0.9 % IV BOLUS (SEPSIS)
1000.0000 mL | Freq: Once | INTRAVENOUS | Status: AC
  Administered 2016-01-27: 1000 mL via INTRAVENOUS

## 2016-01-27 MED ORDER — ASPIRIN 81 MG PO TABS: 81.0000 mg | ORAL_TABLET | ORAL | Status: DC

## 2016-01-27 MED ORDER — FINASTERIDE 5 MG PO TABS
5.0000 mg | ORAL_TABLET | Freq: Every morning | ORAL | Status: DC
  Administered 2016-01-28 – 2016-01-29 (×2): 5 mg via ORAL
  Filled 2016-01-27 (×2): qty 1

## 2016-01-27 MED ORDER — CARVEDILOL 25 MG PO TABS
25.0000 mg | ORAL_TABLET | Freq: Two times a day (BID) | ORAL | Status: DC
  Administered 2016-01-28 – 2016-01-29 (×4): 25 mg via ORAL
  Filled 2016-01-27 (×4): qty 1

## 2016-01-27 MED ORDER — ASPIRIN EC 81 MG PO TBEC
81.0000 mg | DELAYED_RELEASE_TABLET | Freq: Every day | ORAL | Status: DC
  Administered 2016-01-28 – 2016-01-29 (×2): 81 mg via ORAL
  Filled 2016-01-27 (×2): qty 1

## 2016-01-27 NOTE — Progress Notes (Signed)
Patient had tick bite under left thigh two days ago.  Areas marked and MD informed.

## 2016-01-27 NOTE — ED Notes (Signed)
Pt here with c/o central chest pain that started yesterday. He states he had multiple episodes of vomiting yesterday after eating and wonders if his chest hurts from all the vomiting. Denies SOB, but states he feels nausea and weak.

## 2016-01-27 NOTE — Plan of Care (Signed)
Problem: Nutritional: Goal: Ability to achieve adequate nutritional intake will improve Outcome: Progressing Discussed with patient status of nothing by mouth and why.

## 2016-01-27 NOTE — ED Notes (Signed)
Pt reports he took three baby aspirins prior to arrival.

## 2016-01-27 NOTE — ED Provider Notes (Signed)
CSN: [id number]     Arrival date & time 01/27/16  1554 History   First MD Initiated Contact with Patient 01/27/16 1624     Chief Complaint  Patient presents with  . Chest Pain     (Consider location/radiation/quality/duration/timing/severity/associated sxs/prior Treatment) HPI Patient presents with chest pain starting yesterday after vomiting roughly 8 times. He states the pain feels like "indigestion". There is no radiation. He's had some persistent nausea but no further vomiting. Denies any shortness of breath. Patient has had multiple episodes of diarrhea yesterday. Decreased appetite. Denies fever or chills. Admits to generalized weakness. Took 3 baby aspirin prior to coming to the emergency department. States his chest pain has improved. Is followed by cardiologist out of [place]. Past Medical History  Diagnosis Date  . CAD (coronary artery disease)     January, 2012, nuclear, EF 61%, normal  . Renal insufficiency   . Diabetes mellitus   . Hyperlipidemia   . HTN (hypertension), benign   . Pain 02/2010    right neck, right shoulder, right wrist, right leg-not cardiac  . Hx of CABG     2005  . Carotid artery disease ([place])     Doppler, August, 2011, [id number] RIC A., [id number] LICA, followup one year  . Psoriatic arthritis ([place])     2012  . Ejection fraction   . Gout   . Arthritis   . ASCVD (arteriosclerotic cardiovascular disease)    Past Surgical History  Procedure Laterality Date  . Coronary artery bypass graft  2005  . Spinal fusion    . Coronary artery bypass graft     Family History  Problem Relation Age of Onset  . CVA Mother   . Heart attack Father    Social History  Substance Use Topics  . Smoking status: Former Smoker -- 0.50 packs/day for 10 years    Types: Cigarettes    Quit date: 03/21/1965  . Smokeless tobacco: Never Used  . Alcohol Use: No    Review of Systems  Constitutional: Positive for fatigue. Negative for fever and chills.  Respiratory: Negative  for cough and shortness of breath.   Cardiovascular: Positive for chest pain. Negative for palpitations and leg swelling.  Gastrointestinal: Positive for nausea, vomiting and diarrhea. Negative for abdominal pain and blood in stool.  Musculoskeletal: Negative for back pain and neck pain.  Skin: Negative for rash and wound.  Neurological: Positive for weakness (generalized). Negative for dizziness, light-headedness, numbness and headaches.  All other systems reviewed and are negative.     Allergies  Sulfa antibiotics  Home Medications   Prior to Admission medications   Medication Sig Start Date End Date Taking? Authorizing Provider  allopurinol (ZYLOPRIM) 300 MG tablet Take 300 mg by mouth every evening.    Yes Historical Provider, MD  aspirin 81 MG tablet Take 81 mg by mouth every morning.    Yes Historical Provider, MD  carvedilol (COREG) 25 MG tablet Take 25 mg by mouth 2 (two) times daily with a meal.    Yes Historical Provider, MD  Cholecalciferol (VITAMIN D3) 5000 UNITS CAPS Take 5,000 Units by mouth every morning.    Yes Historical Provider, MD  docusate sodium (COLACE) 100 MG capsule Take 100 mg by mouth daily as needed for mild constipation.    Yes Historical Provider, MD  finasteride (PROSCAR) 5 MG tablet Take 5 mg by mouth every morning.    Yes Historical Provider, MD  folic acid (FOLVITE) 1 MG tablet Take 1 mg by  mouth every evening.    Yes Historical Provider, MD  lisinopril (PRINIVIL,ZESTRIL) 20 MG tablet Take 20 mg by mouth every evening. 01/12/16  Yes Historical Provider, MD  metFORMIN (GLUCOPHAGE) 1000 MG tablet Take 500-1,000 mg by mouth 2 (two) times daily with a meal. 500 mg every morning and 1000 mg every evening   Yes Historical Provider, MD  methotrexate (RHEUMATREX) 2.5 MG tablet Take 10 mg by mouth every Wednesday. Caution:Chemotherapy. Protect from light.   Yes Historical Provider, MD  simvastatin (ZOCOR) 40 MG tablet Take 40 mg by mouth at bedtime. 12/04/15  Yes  Historical Provider, MD   BP 164/73 mmHg  Pulse 69  Temp(Src) 97.9 F (36.6 C) (Oral)  Resp 17  SpO2 96% Physical Exam  Constitutional: He is oriented to person, place, and time. He appears well-developed and well-nourished. No distress.  HENT:  Head: Normocephalic and atraumatic.  Mouth/Throat: Oropharynx is clear and moist. No oropharyngeal exudate.  Eyes: EOM are normal. Pupils are equal, round, and reactive to light.  Neck: Normal range of motion. Neck supple. No JVD present.  Cardiovascular: Normal rate and regular rhythm.  Exam reveals no gallop and no friction rub.   No murmur heard. Pulmonary/Chest: Effort normal and breath sounds normal. No respiratory distress. He has no wheezes. He has no rales. He exhibits no tenderness.  Abdominal: Soft. Bowel sounds are normal. He exhibits no distension and no mass. There is tenderness (patient with epigastric discomfort to palpation.). There is no rebound and no guarding.  Musculoskeletal: Normal range of motion. He exhibits no edema or tenderness.  No lower extremity swelling, asymmetry or tenderness.  Neurological: He is alert and oriented to person, place, and time.  Skin: Skin is warm and dry. No rash noted. No erythema.  Psychiatric: He has a normal mood and affect. His behavior is normal.  Nursing note and vitals reviewed.   ED Course  Procedures (including critical care time) Labs Review Labs Reviewed  BASIC METABOLIC PANEL - Abnormal; Notable for the following:    Chloride 98 (*)    Glucose, Bld 145 (*)    BUN 33 (*)    Creatinine, Ser 2.32 (*)    GFR calc non Af Amer 25 (*)    GFR calc Af Amer 29 (*)    All other components within normal limits  CBC - Abnormal; Notable for the following:    WBC 18.2 (*)    Hemoglobin 12.4 (*)    HCT 38.3 (*)    All other components within normal limits  HEPATIC FUNCTION PANEL - Abnormal; Notable for the following:    AST 317 (*)    ALT 308 (*)    Alkaline Phosphatase 158 (*)     Total Bilirubin 5.6 (*)    Bilirubin, Direct 3.3 (*)    Indirect Bilirubin 2.3 (*)    All other components within normal limits  LIPASE, BLOOD - Abnormal; Notable for the following:    Lipase 558 (*)    All other components within normal limits  URINALYSIS, ROUTINE W REFLEX MICROSCOPIC (NOT AT [facility])  Randolm Idol, ED    Imaging Review Dg Chest 2 View  01/27/2016  CLINICAL DATA:  Chest pain for 1 day EXAM: CHEST  2 VIEW COMPARISON:  11/05/2010 FINDINGS: Cardiomediastinal silhouette is stable. Status post CABG. No acute infiltrate or pleural effusion. No pulmonary edema. Bony thorax is unremarkable. IMPRESSION: No active cardiopulmonary disease. Electronically Signed   By: Lahoma Crocker M.D.   On: 01/27/2016 17:35  US Abdomen Limited  01/27/2016  CLINICAL DATA:  Upper abdominal pain for 1 day EXAM: US ABDOMEN LIMITED - RIGHT UPPER QUADRANT COMPARISON:  None. FINDINGS: Gallbladder: 3.3 mm gallbladder wall.  No stones.  No Murphy sign. Common bile duct: Diameter: 3 mm Liver: No focal abnormalities.  Mildly increased echogenicity. IMPRESSION: Mild hepatic parenchymal disease likely steatosis. Minimal gallbladder wall thickening, a nonspecific finding. Electronically Signed   By: Skipper Cliche M.D.   On: 01/27/2016 19:42   I have personally reviewed and evaluated these images and lab results as part of my medical decision-making.   EKG Interpretation   Date/Time:  Saturday January 27 2016 16:01:38 EDT Ventricular Rate:  78 PR Interval:  180 QRS Duration: 80 QT Interval:  398 QTC Calculation: 453 R Axis:   32 Text Interpretation:  Sinus rhythm with occasional Premature ventricular  complexes and Premature atrial complexes Nonspecific ST abnormality  Abnormal ECG Confirmed by Loreena Valeri  MD, Eliane Hammersmith ([id number]) on 01/27/2016  4:55:05 PM      MDM   Final diagnoses:  Acute pancreatitis, unspecified pancreatitis type  Atypical chest pain  Renal insufficiency  Abnormal LFTs    Patient with  normal initial troponin. EKG without any acute changes. Patient does have elevation in lipase and liver function tests. Ultrasound without evidence of gallbladder or common bile duct stones. Discussed with family medicine service. We'll admit to step down bed given patient has atypical chest pain. This is likely gastrointestinal in nature and not cardiac.     Julianne Rice, MD 01/27/16 2112

## 2016-01-27 NOTE — ED Notes (Signed)
Patient transported to X-ray 

## 2016-01-27 NOTE — H&P (Signed)
[facility] Admission History and Physical Service Pager: [phone]  Patient name: Dan Holmes Medical record number: [id number] Date of birth: 09/19/1931 Age: 82 y.o. Gender: male  Primary Care Provider: Ann Held, MD Consultants: none Code Status: Limited. DNI   Chief Complaint: abdominal pain   Assessment and Plan: Dan Holmes is a 82 y.o. male presenting with abdominal pain. PMH is significant for CAD s/p CABG, dyslipidemia, psoriatic arthritis, HTN, Dm2.   #Abdominal pain: lipase is elevated to 558 which is suggestive of pancreatitis. RUQ [place] was not revealing for stones to suggest a source. He reports his blood sugars are well controlled (125 and below). Denies drinking alcohol. Possible for autoimmune with his history of psoriatic arthritis.  He had a normal stress test less than one year ago. Reports to having dark stools but no prior history of GI bleed or ulcer. No prior history of reflux and not taking a PPI.  - admit to telemetry, Dr. Nori Riis attending  - NPO  - Fluids @ 125 mL/hr  - IV morphine 2 mg Q2 PRN - Hgb A1c    #Transaminitis: possible to be related to pancreatitis vs steatosis as seen on [place]. Denies any alcohol use.  - CMP  - may need order CT abdomen, viral serologies or autoimmune markers if no improvement  - lipid panel pending   #Elevated Scr: unclear what his baseline is.  - holding lisinopril and metformin  - urinalysis, urine sodium and creatinine   #CAD s/p CABG, dyslipidemia: reports normal stress test less than one year ago.  - EKG AM  - cycle troponin's  - holding statin. - continue ASA 81 mg   #Psoriatic arthritis: takes MTX once a week. Stable and well controlled  - may consider ordering CRP, ESR   #Dm2: reports to being well controlled  - holding metformin  - may need to start SSI once PO is started   #Gout: reports to being well controlled - continue allopurinol   FEN/GI: NPO, NS 125 mL/hr   Prophylaxis: heparin SubQ  Disposition: admitted to FMTS for abdominal pain   History of Present Illness:  Dan Holmes is a 82 y.o. male presenting with abdominal pain. Had emesis on Friday about 7-8 times and his pain started on Friday. He hasn't had any vomiting or black stools since then. Pain has stayed the same since Friday. It is sharp. Reports the pain goes away when his stomach "growls." He hasn't been hungry.  The contents of the emesis was food.  The same things happened three weeks ago. Reports to having kidney stones. No new foods, medicines, sick contacts, and no travel. Takes a baby aspirin everyday. He had a CABG about 12 years ago. Reports having a normal stress test than a year ago. Denies any regular NSAIDS.  Has had normal colonoscopies. Pain is localized and epigastric in nature. Denies any association with exercise. No diaphoresis. Has had some nausea or vomiting. He hasn't TUMS or Nitro.   Review Of Systems: Per HPI with the following additions: see HPI  Otherwise the remainder of the systems were negative.  Patient Active Problem List   Diagnosis Date Noted  . Pancreatitis 01/27/2016  . Cough due to ACE inhibitor 12/16/2014  . Dyslipidemia 05/28/2013  . Ejection fraction   . Psoriatic arthritis ([place])   . CAD (coronary artery disease)   . Renal insufficiency   . Diabetes mellitus   . HTN (hypertension), benign   . Hx of  CABG   . Carotid artery disease ([place])   . Pain 02/16/2010    Past Medical History: Past Medical History  Diagnosis Date  . CAD (coronary artery disease)     January, 2012, nuclear, EF 61%, normal  . Renal insufficiency   . Diabetes mellitus   . Hyperlipidemia   . HTN (hypertension), benign   . Pain 02/2010    right neck, right shoulder, right wrist, right leg-not cardiac  . Hx of CABG     2005  . Carotid artery disease ([place])     Doppler, August, 2011, 09-38% RIC A., 1-82% LICA, followup one year  . Psoriatic arthritis ([place])      2012  . Ejection fraction   . Gout   . Arthritis   . ASCVD (arteriosclerotic cardiovascular disease)     Past Surgical History: Past Surgical History  Procedure Laterality Date  . Coronary artery bypass graft  2005  . Spinal fusion    . Coronary artery bypass graft      Social History: Social History  Substance Use Topics  . Smoking status: Former Smoker -- 0.50 packs/day for 10 years    Types: Cigarettes    Quit date: 03/21/1965  . Smokeless tobacco: Never Used  . Alcohol Use: No   Additional social history: no tobacco or alcohol use.   Please also refer to relevant sections of EMR.  Family History: Family History  Problem Relation Age of Onset  . CVA Mother   . Heart attack Father     Allergies and Medications: Allergies  Allergen Reactions  . Sulfa Antibiotics Other (See Comments)    Unknown   No current facility-administered medications on file prior to encounter.   Current Outpatient Prescriptions on File Prior to Encounter  Medication Sig Dispense Refill  . allopurinol (ZYLOPRIM) 300 MG tablet Take 300 mg by mouth every evening.     Marland Kitchen aspirin 81 MG tablet Take 81 mg by mouth every morning.     . carvedilol (COREG) 25 MG tablet Take 25 mg by mouth 2 (two) times daily with a meal.     . Cholecalciferol (VITAMIN D3) 5000 UNITS CAPS Take 5,000 Units by mouth every morning.     . finasteride (PROSCAR) 5 MG tablet Take 5 mg by mouth every morning.     . folic acid (FOLVITE) 1 MG tablet Take 1 mg by mouth every evening.     . metFORMIN (GLUCOPHAGE) 1000 MG tablet Take 500-1,000 mg by mouth 2 (two) times daily with a meal. 500 mg every morning and 1000 mg every evening    . methotrexate (RHEUMATREX) 2.5 MG tablet Take 10 mg by mouth every Wednesday. Caution:Chemotherapy. Protect from light.      Objective: BP 164/73 mmHg  Pulse 69  Temp(Src) 97.9 F (36.6 C) (Oral)  Resp 17  SpO2 96% Exam: General: lying in bed, elderly male, comfortable  Eyes: clear  conjunctiva  ENTM: MMM, uvula midline,  Neck: supple  Cardiovascular: S1S2, RRR, no murmurs, rubs or gallops  Respiratory: CTAB, no wheezing or crackles.  Abdomen: soft, tender in the upper, central abdomen. Negative murphy's sign, non distended, +BS  MSK: moves all freely, winged scapula, no reproduction of pain with palpation of the chest,  Skin: no rashes  Neuro: no gross deficits  Psych: alert and oriented   Labs and Imaging: CBC BMET   Recent Labs Lab 01/27/16 1630  WBC 18.2*  HGB 12.4*  HCT 38.3*  PLT 181  Recent Labs Lab 01/27/16 1630  NA 136  K 4.2  CL 98*  CO2 28  BUN 33*  CREATININE 2.32*  GLUCOSE 145*  CALCIUM 9.4     Dg Chest 2 View  01/27/2016  CLINICAL DATA:  Chest pain for 1 day EXAM: CHEST  2 VIEW COMPARISON:  11/05/2010 FINDINGS: Cardiomediastinal silhouette is stable. Status post CABG. No acute infiltrate or pleural effusion. No pulmonary edema. Bony thorax is unremarkable. IMPRESSION: No active cardiopulmonary disease. Electronically Signed   By: Lahoma Crocker M.D.   On: 01/27/2016 17:35   US Abdomen Limited  01/27/2016  CLINICAL DATA:  Upper abdominal pain for 1 day EXAM: US ABDOMEN LIMITED - RIGHT UPPER QUADRANT COMPARISON:  None. FINDINGS: Gallbladder: 3.3 mm gallbladder wall.  No stones.  No Murphy sign. Common bile duct: Diameter: 3 mm Liver: No focal abnormalities.  Mildly increased echogenicity. IMPRESSION: Mild hepatic parenchymal disease likely steatosis. Minimal gallbladder wall thickening, a nonspecific finding. Electronically Signed   By: Skipper Cliche M.D.   On: 01/27/2016 19:42     Rosemarie Ax, MD 01/27/2016, 9:05 PM PGY-3, [place] Intern pager: [phone], text pages welcome

## 2016-01-27 NOTE — ED Notes (Signed)
Attempted to call report

## 2016-01-28 DIAGNOSIS — R945 Abnormal results of liver function studies: Secondary | ICD-10-CM | POA: Insufficient documentation

## 2016-01-28 DIAGNOSIS — N189 Chronic kidney disease, unspecified: Secondary | ICD-10-CM | POA: Diagnosis not present

## 2016-01-28 DIAGNOSIS — E785 Hyperlipidemia, unspecified: Secondary | ICD-10-CM | POA: Diagnosis not present

## 2016-01-28 DIAGNOSIS — I129 Hypertensive chronic kidney disease with stage 1 through stage 4 chronic kidney disease, or unspecified chronic kidney disease: Secondary | ICD-10-CM | POA: Diagnosis not present

## 2016-01-28 DIAGNOSIS — R0789 Other chest pain: Secondary | ICD-10-CM | POA: Diagnosis not present

## 2016-01-28 DIAGNOSIS — N289 Disorder of kidney and ureter, unspecified: Secondary | ICD-10-CM | POA: Diagnosis not present

## 2016-01-28 DIAGNOSIS — R7989 Other specified abnormal findings of blood chemistry: Secondary | ICD-10-CM | POA: Diagnosis not present

## 2016-01-28 DIAGNOSIS — R1084 Generalized abdominal pain: Secondary | ICD-10-CM | POA: Diagnosis not present

## 2016-01-28 DIAGNOSIS — K859 Acute pancreatitis without necrosis or infection, unspecified: Secondary | ICD-10-CM | POA: Insufficient documentation

## 2016-01-28 DIAGNOSIS — I2510 Atherosclerotic heart disease of native coronary artery without angina pectoris: Secondary | ICD-10-CM | POA: Diagnosis not present

## 2016-01-28 DIAGNOSIS — E119 Type 2 diabetes mellitus without complications: Secondary | ICD-10-CM | POA: Diagnosis not present

## 2016-01-28 DIAGNOSIS — M1990 Unspecified osteoarthritis, unspecified site: Secondary | ICD-10-CM | POA: Diagnosis not present

## 2016-01-28 LAB — CBC
HCT: 35.3 % — ABNORMAL LOW (ref 39.0–52.0)
HEMOGLOBIN: 11.3 g/dL — AB (ref 13.0–17.0)
MCH: 28.3 pg (ref 26.0–34.0)
MCHC: 32 g/dL (ref 30.0–36.0)
MCV: 88.5 fL (ref 78.0–100.0)
PLATELETS: 150 10*3/uL (ref 150–400)
RBC: 3.99 MIL/uL — AB (ref 4.22–5.81)
RDW: 15.5 % (ref 11.5–15.5)
WBC: 12.3 10*3/uL — AB (ref 4.0–10.5)

## 2016-01-28 LAB — URINALYSIS, ROUTINE W REFLEX MICROSCOPIC
Glucose, UA: NEGATIVE mg/dL
Hgb urine dipstick: NEGATIVE
KETONES UR: 15 mg/dL — AB
NITRITE: NEGATIVE
Protein, ur: NEGATIVE mg/dL
Specific Gravity, Urine: 1.015 (ref 1.005–1.030)
pH: 5 (ref 5.0–8.0)

## 2016-01-28 LAB — COMPREHENSIVE METABOLIC PANEL
ALT: 217 U/L — AB (ref 17–63)
ANION GAP: 9 (ref 5–15)
AST: 175 U/L — ABNORMAL HIGH (ref 15–41)
Albumin: 3.4 g/dL — ABNORMAL LOW (ref 3.5–5.0)
Alkaline Phosphatase: 139 U/L — ABNORMAL HIGH (ref 38–126)
BUN: 31 mg/dL — ABNORMAL HIGH (ref 6–20)
CHLORIDE: 102 mmol/L (ref 101–111)
CO2: 25 mmol/L (ref 22–32)
CREATININE: 1.87 mg/dL — AB (ref 0.61–1.24)
Calcium: 8.7 mg/dL — ABNORMAL LOW (ref 8.9–10.3)
GFR, EST AFRICAN AMERICAN: 37 mL/min — AB (ref >60)
GFR, EST NON AFRICAN AMERICAN: 32 mL/min — AB (ref >60)
Glucose, Bld: 146 mg/dL — ABNORMAL HIGH (ref 65–99)
Potassium: 3.7 mmol/L (ref 3.5–5.1)
SODIUM: 136 mmol/L (ref 135–145)
Total Bilirubin: 4.1 mg/dL — ABNORMAL HIGH (ref 0.3–1.2)
Total Protein: 5.7 g/dL — ABNORMAL LOW (ref 6.5–8.1)

## 2016-01-28 LAB — URINE MICROSCOPIC-ADD ON: RBC / HPF: NONE SEEN RBC/hpf (ref 0–5)

## 2016-01-28 LAB — PROTIME-INR
INR: 1.27 (ref 0.00–1.49)
PROTHROMBIN TIME: 16 s — AB (ref 11.6–15.2)

## 2016-01-28 LAB — TROPONIN I
TROPONIN I: 0.03 ng/mL (ref <0.031)
Troponin I: <0.03 ng/mL (ref <0.031)

## 2016-01-28 LAB — LIPID PANEL
CHOL/HDL RATIO: 2.8 ratio
Cholesterol: 93 mg/dL (ref 0–200)
HDL: 33 mg/dL — AB (ref >40)
LDL Cholesterol: 47 mg/dL (ref 0–99)
Triglycerides: 67 mg/dL (ref <150)
VLDL: 13 mg/dL (ref 0–40)

## 2016-01-28 LAB — GLUCOSE, CAPILLARY: GLUCOSE-CAPILLARY: 133 mg/dL — AB (ref 65–99)

## 2016-01-28 LAB — CREATININE, URINE, RANDOM: CREATININE, URINE: 169.81 mg/dL

## 2016-01-28 LAB — MRSA PCR SCREENING: MRSA BY PCR: NEGATIVE

## 2016-01-28 LAB — SODIUM, URINE, RANDOM: Sodium, Ur: 48 mmol/L

## 2016-01-28 NOTE — Progress Notes (Signed)
Family Medicine Teaching Service Daily Progress Note Intern Pager: [phone]  Patient name: Dan Holmes Medical record number: [id number] Date of birth: [birth year] Age: 82 y.o. Gender: male  Primary Care Provider: Ann Held, MD Consultants: none  Code Status: Limited (DNI)   Pt Overview and Major Events to Date:  6/10: admitted for abdominal pain   Assessment and Plan: Dan Holmes is a 82 y.o. male presenting with abdominal pain. PMH is significant for CAD s/p CABG, dyslipidemia, psoriatic arthritis, HTN, Dm2.   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  - CMP  - HepB surface antigen, hep B sufrace antibody, Hep B ab  - may need order CT abdomen, autoimmune markers if no improvement   #AKI: improving. FeNA: 0.5% which suggests PreRenal  - holding lisinopril and metformin   #CAD s/p CABG, dyslipidemia: reports normal stress test less than one year ago.  - troponin's: normal   - holding statin. - continue ASA 81 mg   #Psoriatic arthritis: takes MTX once a week. Stable and well controlled   #Dm2: reports to being well controlled  - holding metformin  - may need to start SSI once PO is started   #Gout: reports to being well controlled - continue allopurinol   FEN/GI: NPO, NS 125 mL/hr  Prophylaxis: heparin SubQ  Disposition: pending improvement   Subjective:  Having some pain but feeling better. Denies any vomiting.   Objective: Temp:  [97.9 F (36.6 C)-98.6 F (37 C)] 98.6 F (37 C) (06/11 0734) Pulse Rate:  [63-82] 71 (06/11 0734) Resp:  [13-25] 17 (06/11 0734) BP: (144-184)/(67-87) 183/69 mmHg (06/11 0734) SpO2:  [92 %-100 %] 97 % (06/11 0734) Weight:   [176 lb 9.6 oz (80.105 kg)] 176 lb 9.6 oz (80.105 kg) (06/11 0218) Physical Exam: General: lying in bed, Cardiovascular: S1S2, RRR, no murmurs, rubs or gallps  Respiratory: CTAB, no wheezes or crackles  Abdomen: soft, non distended, +BS  Extremities: moves all freely   Laboratory:  Recent Labs Lab 01/27/16 1630 01/28/16 0453  WBC 18.2* 12.3*  HGB 12.4* 11.3*  HCT 38.3* 35.3*  PLT 181 150    Recent Labs Lab 01/27/16 1630 01/28/16 0453  NA 136 136  K 4.2 3.7  CL 98* 102  CO2 28 25  BUN 33* 31*  CREATININE 2.32* 1.87*  CALCIUM 9.4 8.7*  PROT 6.8 5.7*  BILITOT 5.6* 4.1*  ALKPHOS 158* 139*  ALT 308* 217*  AST 317* 175*  GLUCOSE 145* 146*   Imaging/Diagnostic Tests:   Rosemarie Ax, MD 01/28/2016, 8:06 AM PGY-3, [place] Intern pager: [phone], text pages welcome

## 2016-01-28 NOTE — Progress Notes (Signed)
Patient transported via wheelchair by NT to 6E07 stable, alert and oriented to person, place, time and situation.

## 2016-01-29 DIAGNOSIS — R7989 Other specified abnormal findings of blood chemistry: Secondary | ICD-10-CM | POA: Diagnosis not present

## 2016-01-29 DIAGNOSIS — K859 Acute pancreatitis without necrosis or infection, unspecified: Secondary | ICD-10-CM | POA: Diagnosis not present

## 2016-01-29 DIAGNOSIS — R1084 Generalized abdominal pain: Secondary | ICD-10-CM | POA: Diagnosis not present

## 2016-01-29 LAB — COMPREHENSIVE METABOLIC PANEL
ALBUMIN: 3 g/dL — AB (ref 3.5–5.0)
ALT: 132 U/L — ABNORMAL HIGH (ref 17–63)
ANION GAP: 8 (ref 5–15)
AST: 69 U/L — AB (ref 15–41)
Alkaline Phosphatase: 120 U/L (ref 38–126)
BUN: 16 mg/dL (ref 6–20)
CHLORIDE: 102 mmol/L (ref 101–111)
CO2: 26 mmol/L (ref 22–32)
Calcium: 8.3 mg/dL — ABNORMAL LOW (ref 8.9–10.3)
Creatinine, Ser: 1.26 mg/dL — ABNORMAL HIGH (ref 0.61–1.24)
GFR calc Af Amer: 60 mL/min — ABNORMAL LOW (ref >60)
GFR calc non Af Amer: 52 mL/min — ABNORMAL LOW (ref >60)
GLUCOSE: 131 mg/dL — AB (ref 65–99)
POTASSIUM: 3.4 mmol/L — AB (ref 3.5–5.1)
SODIUM: 136 mmol/L (ref 135–145)
TOTAL PROTEIN: 5.4 g/dL — AB (ref 6.5–8.1)
Total Bilirubin: 2.3 mg/dL — ABNORMAL HIGH (ref 0.3–1.2)

## 2016-01-29 LAB — URINE CULTURE: Culture: 1000 — AB

## 2016-01-29 LAB — HEPATITIS B SURFACE ANTIBODY, QUANTITATIVE

## 2016-01-29 LAB — GLUCOSE, CAPILLARY: Glucose-Capillary: 120 mg/dL — ABNORMAL HIGH (ref 65–99)

## 2016-01-29 LAB — HEMOGLOBIN A1C
HEMOGLOBIN A1C: 6.7 % — AB (ref 4.8–5.6)
MEAN PLASMA GLUCOSE: 146 mg/dL

## 2016-01-29 LAB — HEPATITIS C ANTIBODY: HCV Ab: <0.1 s/co ratio (ref 0.0–0.9)

## 2016-01-29 LAB — HEPATITIS B SURFACE ANTIGEN: HEP B S AG: NEGATIVE

## 2016-01-29 MED ORDER — POTASSIUM CHLORIDE CRYS ER 20 MEQ PO TBCR
40.0000 meq | EXTENDED_RELEASE_TABLET | Freq: Once | ORAL | Status: AC
  Administered 2016-01-29: 40 meq via ORAL
  Filled 2016-01-29: qty 2

## 2016-01-29 NOTE — Progress Notes (Signed)
Patient discharge teaching given, including activity, diet, follow-up appoints, and medications. Patient verbalized understanding of all discharge instructions. IV access was d/c'd. Vitals are stable. Skin is intact except as charted in most recent assessments. Pt to be escorted out by NT, to be driven home by family.  Jd Mccaster, MBA, BSN, RN 

## 2016-01-29 NOTE — Progress Notes (Signed)
Family Medicine Teaching Service Daily Progress Note Intern Pager: [phone]  Patient name: SANTI SUMINSKI Medical record number: [id number] Date of birth: 1932/05/29 Age: 82 y.o. Gender: male  Primary Care Provider: Ann Held, MD Consultants: none  Code Status: Limited (DNI)   Pt Overview and Major Events to Date:  6/10: admitted for abdominal pain  -> pancreatitis diagnosed 6/11: No acute events. Liver enzymes trending down.   Assessment and Plan: CAPONE ZAWISTOWSKI is a 82 y.o. male presenting with abdominal pain. PMH is significant for CAD s/p CABG, dyslipidemia, psoriatic arthritis, HTN, Dm2.   #Pancreatitis: amylase and lipase both elevated  - Advancing diet as tolerated today  - No current abd pain - KVO fluids - Morphine 2 mg Q2 PRN   #Transaminitis: improving.  - AST:175-->69  ALT:217-->137 - HepB surface antigen, hep B sufrace antibody, Hep B ab pending    #AKI: improving. FeNA: 0.5% which suggests PreRenal  - holding lisinopril and metformin  - Creat 1.83>1.26 - Urine output WNL  #CAD s/p CABG, dyslipidemia: reports normal stress test less than one year ago.  - troponin's: normal x3  - holding statin - continue ASA 81 mg   #HTN: controlled at 134/65 -Holding lisinopril in setting of AKI -Continue coreg   #Psoriatic arthritis: takes MTX once a week on Wed. Stable and well controlled   #DM2: reports to being well controlled  - holding metformin - A1c 6.7   #Gout: reports to being well controlled - continue allopurinol   FEN/GI:  Advance diet as tolerated, KVO fluids Prophylaxis: heparin SubQ   Disposition: Likely home this afternoon if tolerates soft diet  Subjective:  No acute events over night. Pt reports that he feels well.    Objective: Temp:  [98.6 F (37 C)-98.9 F (37.2 C)] 98.7 F (37.1 C) (06/12 0729) Pulse Rate:  [64-71] 64 (06/12 0729) Resp:  [16-18] 16 (06/12 0729) BP: (134-183)/(59-69) 134/65 mmHg (06/12  0729) SpO2:  [96 %-97 %] 96 % (06/12 0729) Weight:  [81.2 kg (179 lb 0.2 oz)] 81.2 kg (179 lb 0.2 oz) (06/11 2111) Physical Exam: General: well appearing, laying in bed, no acute distress HEENT: Mild scleral icterus. MMM Cardiovascular: S1S2, RRR, no murmurs, rubs or gallops  Respiratory: CTAB, no wheezes or crackles  Abdomen: soft, non distended, +BS  Extremities: moves all freely   Laboratory:  Recent Labs Lab 01/27/16 1630 01/28/16 0453  WBC 18.2* 12.3*  HGB 12.4* 11.3*  HCT 38.3* 35.3*  PLT 181 150    Recent Labs Lab 01/27/16 1630 01/28/16 0453 01/29/16 0409  NA 136 136 136  K 4.2 3.7 3.4*  CL 98* 102 102  CO2 28 25 26   BUN 33* 31* 16  CREATININE 2.32* 1.87* 1.26*  CALCIUM 9.4 8.7* 8.3*  PROT 6.8 5.7* 5.4*  BILITOT 5.6* 4.1* 2.3*  ALKPHOS 158* 139* 120  ALT 308* 217* 132*  AST 317* 175* 69*  GLUCOSE 145* 146* 131*   Imaging/Diagnostic Tests: Dg Chest 2 View  01/27/2016  CLINICAL DATA:  Chest pain for 1 day EXAM: CHEST  2 VIEW COMPARISON:  11/05/2010 FINDINGS: Cardiomediastinal silhouette is stable. Status post CABG. No acute infiltrate or pleural effusion. No pulmonary edema. Bony thorax is unremarkable. IMPRESSION: No active cardiopulmonary disease. Electronically Signed   By: Lahoma Crocker M.D.   On: 01/27/2016 17:35   US Abdomen Limited  01/27/2016  CLINICAL DATA:  Upper abdominal pain for 1 day EXAM: US ABDOMEN LIMITED - RIGHT UPPER QUADRANT COMPARISON:  None.  FINDINGS: Gallbladder: 3.3 mm gallbladder wall.  No stones.  No Murphy sign. Common bile duct: Diameter: 3 mm Liver: No focal abnormalities.  Mildly increased echogenicity. IMPRESSION: Mild hepatic parenchymal disease likely steatosis. Minimal gallbladder wall thickening, a nonspecific finding. Electronically Signed   By: Skipper Cliche M.D.   On: 01/27/2016 19:42    [place], Med Student 01/29/2016, 7:32 AM  [facility] Intern pager: [phone], text pages welcome   RESIDENT ADDENDUM I have separately  seen and examined the patient. I have discussed the findings and exam with the medical student and agree with the above note. I helped develop the management plan that is described in the student's note, and I agree with the content.  Additionally I have outlined my exam and assessment/plan below:  PE: General: well-appearing, NAD, sitting in bedside chair Lungs CTAB Cardiac: RRR Abd: soft, non-tender, non-distended, +BS   A/P: SHARON BENTE is a 82 y.o. male admitted for pancreatitis. Has tolerated liquid diet and now denies any abdominal pain. Did not require prn pain medications overnight.Transamninitis improved. Advancing diet for lunch and if tolerates can likely go home.  All other chronic disease states stable and as above.   Dispo: Home today   Luiz Blare, DO 01/29/2016, 11:38 AM

## 2016-01-29 NOTE — Discharge Summary (Signed)
[facility] Discharge Summary  Patient name: Dan Holmes Medical record number: [id number] Date of birth: Apr 04, 1932 Age: 82 y.o. Gender: male Date of Admission: 01/27/2016  Date of Discharge: 01/29/16 Admitting Physician: Dickie La, MD  Primary Care Provider: Ann Held, MD Consultants: None  Indication for Hospitalization: Abdominal pain  Discharge Diagnoses/Problem List:  Pancreatitis  Transaminitis  Renal insufficiency   HTN Psoriatic Arthritis  Dyslipidemia   Disposition: Home  Discharge Condition: Stable  Discharge Exam:  Physical Exam  Constitutional: He appears well-developed and well-nourished. No distress.  HENT:  Head: Normocephalic and atraumatic.  Eyes: Pupils are equal, round, and reactive to light.  Cardiovascular: Normal rate and regular rhythm.   No murmur heard. Pulmonary/Chest: Effort normal and breath sounds normal. No respiratory distress.  Abdominal: Soft. Bowel sounds are normal. He exhibits no distension. There is no tenderness.  Skin: Skin is warm and dry.  Psychiatric: He has a normal mood and affect. His behavior is normal.  Nursing note and vitals reviewed.   Brief Hospital Course:  Pt presented with upper abdominal pain, nausea and vomiting. In the ED he had EKG showing normal sinus rhythm and troponin less that 0.03. Pt had elevated liver enzymes, amylase and lipase concerning for pancreatitis with possible transient gallstone vs dyslipidemia etiology. An abd U/S showed some mild thickening of gallbladder but no signs of gallstones. Pt was given fluids, started on liquid diet and pain medication with further labs ordered for hepatitis panel pending. Pt's abdominal pain improved without need for pain medication over the course of his hospital stay. Pt developed acute kidney insufficiency that improved with maintenance fluids. His transaminitis likely related to possible transient biliary stone vs  methotrexate side effect  improved upon discharge. Upon discharge pt was tolerating a full diet without abdominal pain, nausea or vomiting.   Issues for Follow Up:  1. Hepatitis panel results  Significant Procedures: None  Significant Labs and Imaging:   Recent Labs Lab 01/27/16 1630 01/28/16 0453  WBC 18.2* 12.3*  HGB 12.4* 11.3*  HCT 38.3* 35.3*  PLT 181 150    Recent Labs Lab 01/27/16 1630 01/28/16 0453 01/29/16 0409  NA 136 136 136  K 4.2 3.7 3.4*  CL 98* 102 102  CO2 28 25 26   GLUCOSE 145* 146* 131*  BUN 33* 31* 16  CREATININE 2.32* 1.87* 1.26*  CALCIUM 9.4 8.7* 8.3*  ALKPHOS 158* 139* 120  AST 317* 175* 69*  ALT 308* 217* 132*  ALBUMIN 4.0 3.4* 3.0*      Results/Tests Pending at Time of Discharge:  1. Hepatitis B and C panel   Discharge Medications:    Medication List    TAKE these medications        allopurinol 300 MG tablet  Commonly known as:  ZYLOPRIM  Take 300 mg by mouth every evening.     aspirin 81 MG tablet  Take 81 mg by mouth every morning.     carvedilol 25 MG tablet  Commonly known as:  COREG  Take 25 mg by mouth 2 (two) times daily with a meal.     docusate sodium 100 MG capsule  Commonly known as:  COLACE  Take 100 mg by mouth daily as needed for mild constipation.     finasteride 5 MG tablet  Commonly known as:  PROSCAR  Take 5 mg by mouth every morning.     folic acid 1 MG tablet  Commonly known as:  FOLVITE  Take  1 mg by mouth every evening.     lisinopril 20 MG tablet  Commonly known as:  PRINIVIL,ZESTRIL  Take 20 mg by mouth every evening.     metFORMIN 1000 MG tablet  Commonly known as:  GLUCOPHAGE  Take 500-1,000 mg by mouth 2 (two) times daily with a meal. 500 mg every morning and 1000 mg every evening     methotrexate 2.5 MG tablet  Commonly known as:  RHEUMATREX  Take 10 mg by mouth every Wednesday. Caution:Chemotherapy. Protect from light.     simvastatin 40 MG tablet  Commonly known as:  ZOCOR  Take  40 mg by mouth at bedtime.     Vitamin D3 5000 units Caps  Take 5,000 Units by mouth every morning.        Discharge Instructions: Please refer to Patient Instructions section of EMR for full details.  Patient was counseled important signs and symptoms that should prompt return to medical care, changes in medications, dietary instructions, activity restrictions, and follow up appointments.   Follow-Up Appointments:   Verdia Kuba Courts, Medical Student  Family Medicine   Resident Addendum: Seen and examined with the medical student. I agree with the above with changes made as needed   Leisel Pinette A. Lincoln Brigham MD, [facility] Family [facility] Resident PGY-2 Pager [phone]

## 2016-02-05 DIAGNOSIS — M109 Gout, unspecified: Secondary | ICD-10-CM | POA: Diagnosis not present

## 2016-02-05 DIAGNOSIS — R809 Proteinuria, unspecified: Secondary | ICD-10-CM | POA: Diagnosis not present

## 2016-02-05 DIAGNOSIS — Z0000 Encounter for general adult medical examination without abnormal findings: Secondary | ICD-10-CM | POA: Diagnosis not present

## 2016-02-05 DIAGNOSIS — R1013 Epigastric pain: Secondary | ICD-10-CM | POA: Diagnosis not present

## 2016-02-05 DIAGNOSIS — E1129 Type 2 diabetes mellitus with other diabetic kidney complication: Secondary | ICD-10-CM | POA: Diagnosis not present

## 2016-02-13 ENCOUNTER — Other Ambulatory Visit (HOSPITAL_COMMUNITY): Payer: Self-pay | Admitting: Family Medicine

## 2016-02-13 DIAGNOSIS — L408 Other psoriasis: Secondary | ICD-10-CM | POA: Diagnosis not present

## 2016-02-13 DIAGNOSIS — M810 Age-related osteoporosis without current pathological fracture: Secondary | ICD-10-CM | POA: Diagnosis not present

## 2016-02-13 DIAGNOSIS — M1A00X0 Idiopathic chronic gout, unspecified site, without tophus (tophi): Secondary | ICD-10-CM | POA: Diagnosis not present

## 2016-02-13 DIAGNOSIS — Z09 Encounter for follow-up examination after completed treatment for conditions other than malignant neoplasm: Secondary | ICD-10-CM | POA: Diagnosis not present

## 2016-02-13 DIAGNOSIS — R1013 Epigastric pain: Secondary | ICD-10-CM

## 2016-02-22 ENCOUNTER — Ambulatory Visit (HOSPITAL_COMMUNITY)
Admission: RE | Admit: 2016-02-22 | Discharge: 2016-02-22 | Disposition: A | Discharge status: Home / Self Care | Payer: Commercial Managed Care - HMO | Source: Ambulatory Visit | Attending: Family Medicine | Admitting: Family Medicine

## 2016-02-22 ENCOUNTER — Other Ambulatory Visit (HOSPITAL_COMMUNITY): Payer: Self-pay | Admitting: Family Medicine

## 2016-02-22 DIAGNOSIS — R1013 Epigastric pain: Secondary | ICD-10-CM

## 2016-02-22 MED ORDER — MORPHINE SULFATE (PF) 4 MG/ML IV SOLN
3.0000 mg | Freq: Once | INTRAVENOUS | Status: AC
  Administered 2016-02-22: 3 mg via INTRAVENOUS

## 2016-02-22 MED ORDER — TECHNETIUM TC 99M MEBROFENIN IV KIT
5.0000 | PACK | Freq: Once | INTRAVENOUS | Status: AC | PRN
  Administered 2016-02-22: 5 via INTRAVENOUS

## 2016-02-22 MED ORDER — MORPHINE SULFATE (PF) 4 MG/ML IV SOLN
INTRAVENOUS | Status: AC
  Filled 2016-02-22: qty 1

## 2016-03-01 DIAGNOSIS — Z1389 Encounter for screening for other disorder: Secondary | ICD-10-CM | POA: Diagnosis not present

## 2016-03-01 DIAGNOSIS — R1013 Epigastric pain: Secondary | ICD-10-CM | POA: Diagnosis not present

## 2016-03-18 ENCOUNTER — Ambulatory Visit: Payer: Self-pay | Admitting: Surgery

## 2016-03-18 DIAGNOSIS — K811 Chronic cholecystitis: Secondary | ICD-10-CM | POA: Diagnosis not present

## 2016-03-18 DIAGNOSIS — K5909 Other constipation: Secondary | ICD-10-CM | POA: Diagnosis not present

## 2016-03-18 NOTE — H&P (Signed)
Dan Holmes Parcel 03/18/2016 9:37 AM Location: [facility] Patient #: [id number] DOB: Mar 10, 1932 Married / Language: English / Race: White Male  History of Present Illness Dan Holmes; 03/18/2016 10:36 AM) The patient is a 82 year old male who presents for evaluation of gallbladder disease. Note for "Gallbladder disease": Patient sent from surgical consultation by Dr. Ilda Foil with [facility] family practice. Concern for acalculous chronic cholecystitis.  Pleasant active elderly gentleman. Comes today with his wife. Has had intermittent episodes of epigastric pain. Usually triggered by eating. The first time was after having some potato salad. He wondered it was food poisoning. Then had another attack after a meal. Was very intense. He was wondered if he was having another heart attack a low didn't seem classic for this. [facility] emergency room. The elevated liver function tests. He ruled out for myocardial infarction. Ultrasound showed some mild gallbladder wall thickening but no stones. He felt better. Had follow-up HIDA scan. Cystic duct was occluded gallbladder did not fill. 3 weeks later seen in the office. Completely asymptomatic. No other he etiology noted an abnormal HIDA scan, surgical consultation requested to see if he would benefit from cholecystectomy.  The patient does note he gets some discomfort after eating. Last attack yesterday. Not as severe. Trying to avoid heavy greasy foods. Normally he can eat anything he wants. 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 He is no longer on any blood thinners. Followed by cardiology. Recalls a treadmill stress test certainly years that he did well with. He does have chronic constipation. As this has helped but he doesn't like taking pills. Uses stool softeners. He is on methotrexate for rheumatoid arthritis. He is quite active in the yard and  likes to hunt has an Haematologist. He's never had any abdominal surgery. He recalls a normal colonoscopy at age 76. Has not had one since despite his constipation.  No personal nor family history of GI/colon cancer, inflammatory bowel disease, irritable bowel syndrome, allergy such as Celiac Sprue, dietary/dairy problems, colitis, ulcers nor gastritis. No recent sick contacts/gastroenteritis. No travel outside the country. No changes in diet. No dysphagia to solids or liquids. No significant heartburn or reflux. No hematochezia, hematemesis, coffee ground emesis. No evidence of prior gastric/peptic ulceration.   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 Coronary Artery Bypass Graft Spinal Surgery - Lower Back Tonsillectomy  Diagnostic Studies History Dan Holmes, CMA; 03/18/2016 9:37 AM) Colonoscopy >10 years ago  Allergies Dan Holmes, CMA; 03/18/2016 9:39 AM) Sulfa Antibiotics  Medication History (Sonya Holmes, CMA; 03/18/2016 9:39 AM) Allopurinol (300MG  Tablet, Oral) Active. Carvedilol (25MG  Tablet, Oral) Active. Folic Acid (1MG  Tablet, Oral) Active. Finasteride (5MG  Tablet, Oral) Active. Lisinopril (20MG  Tablet, Oral) Active. MetFORMIN HCl (1000MG  Tablet, Oral) Active. Simvastatin (40MG  Tablet, Oral) Active. Methotrexate (2.5MG  Tablet, Oral) Active. Medications Reconciled  Social History Dan Holmes, CMA; 03/18/2016 9:38 AM) Alcohol use Occasional alcohol use. Caffeine use Coffee. No drug use Tobacco use Former smoker.  Family History Dan Holmes, CMA; 03/18/2016 9:37 AM) Cerebrovascular Accident Mother. Heart Disease Brother, Mother.     Review of Systems ([place]; 03/18/2016 9:38 AM) Skin Not Present- Change in Wart/Mole,  Dryness, Hives, Jaundice, New Lesions, Non-Healing Wounds, Rash and Ulcer. HEENT Present- Hearing Loss. Not Present-  Earache, Hoarseness, Nose Bleed, Oral Ulcers, Ringing in the Ears, Seasonal Allergies, Sinus Pain, Sore Throat, Visual Disturbances, Wears glasses/contact lenses and Yellow Eyes. Respiratory Present- Chronic Cough. Not Present- Bloody sputum, Difficulty Breathing, Snoring and Wheezing. Cardiovascular Not Present- Chest Pain, Difficulty Breathing Lying Down, Leg Cramps, Palpitations, Rapid Heart Rate, Shortness of Breath and Swelling of Extremities. Gastrointestinal Present- Change in Bowel Habits. Not Present- Abdominal Pain, Bloating, Bloody Stool, Chronic diarrhea, Constipation, Difficulty Swallowing, Excessive gas, Gets full quickly at meals, Hemorrhoids, Indigestion, Nausea, Rectal Pain and Vomiting. Male Genitourinary Present- Change in Urinary Stream and Impotence. Not Present- Blood in Urine, Frequency, Nocturia, Painful Urination, Urgency and Urine Leakage. Musculoskeletal Present- Back Pain and Muscle Weakness. Not Present- Joint Pain, Joint Stiffness, Muscle Pain and Swelling of Extremities. Neurological Not Present- Decreased Memory, Fainting, Headaches, Numbness, Seizures, Tingling, Tremor, Trouble walking and Weakness. Psychiatric Not Present- Anxiety, Bipolar, Change in Sleep Pattern, Depression, Fearful and Frequent crying. Hematology Present- Easy Bruising. Not Present- Blood Thinners, Excessive bleeding, Gland problems, HIV and Persistent Infections.  Vitals (Sonya Holmes CMA; 03/18/2016 9:39 AM) 03/18/2016 9:38 AM Weight: 179 lb Height: 70in Body Surface Area: 1.99 m Body Mass Index: 25.68 kg/m  Temp.: 63F(Temporal)  Pulse: 73 (Regular)  BP: 124/74 (Sitting, Left Arm, Standard)      Physical Exam Dan Holmes; 03/18/2016 10:29 AM)  General Mental Status-Alert. General Appearance-Not in acute distress, Not  Sickly. Orientation-Oriented X3. Hydration-Well hydrated. Voice-Normal. Note: Smiling. Pleasant. Affable.  Integumentary Global Assessment Upon inspection and palpation of skin surfaces of the - Axillae: non-tender, no inflammation or ulceration, no drainage. and Distribution of scalp and body hair is normal. General Characteristics Temperature - normal warmth is noted.  Head and Neck Head-normocephalic, atraumatic with no lesions or palpable masses. Face Global Assessment - atraumatic, no absence of expression. Neck Global Assessment - no abnormal movements, no bruit auscultated on the right, no bruit auscultated on the left, no decreased range of motion, non-tender. Trachea-midline. Thyroid Gland Characteristics - non-tender.  Eye Eyeball - Left-Extraocular movements intact, No Nystagmus. Eyeball - Right-Extraocular movements intact, No Nystagmus. Cornea - Left-No Hazy. Cornea - Right-No Hazy. Sclera/Conjunctiva - Left-No scleral icterus, No Discharge. Sclera/Conjunctiva - Right-No scleral icterus, No Discharge. Pupil - Left-Direct reaction to light normal. Pupil - Right-Direct reaction to light normal.  ENMT Ears Pinna - Left - no drainage observed, no generalized tenderness observed. Right - no drainage observed, no generalized tenderness observed. Nose and Sinuses External Inspection of the Nose - no destructive lesion observed. Inspection of the nares - Left - quiet respiration. Right - quiet respiration. Mouth and Throat Lips - Upper Lip - no fissures observed, no pallor noted. Lower Lip - no fissures observed, no pallor noted. Nasopharynx - no discharge present. Oral Cavity/Oropharynx - Tongue - no dryness observed. Oral Mucosa - no cyanosis observed. Hypopharynx - no evidence of airway distress observed.  Chest and Lung Exam Inspection Movements - Normal and Symmetrical. Accessory muscles - No use of accessory muscles in  breathing. Palpation Palpation of the chest reveals - Non-tender. Auscultation Breath sounds - Normal and Clear.  Cardiovascular Auscultation Rhythm - Regular. Murmurs & Other Heart Sounds - Auscultation of the heart reveals - No Murmurs and No Systolic Clicks.  Abdomen Inspection Inspection of the abdomen reveals - No Visible peristalsis and No Abnormal pulsations. Umbilicus - No Bleeding, No Urine drainage. Palpation/Percussion Palpation and Percussion of the abdomen reveal - Soft, Non Tender, No Rebound tenderness, No Rigidity (guarding) and No Cutaneous hyperesthesia.  Note: Abdomen soft. Nontender, nondistended. No guarding. No umbilical no other hernias  Male Genitourinary Sexual Maturity Tanner 5 - Adult hair pattern and Adult penile size and shape. Note: No inguinal hernias. No inguinal lymphadenopathy  Peripheral Vascular Upper Extremity Inspection - Left - No Cyanotic nailbeds, Not Ischemic. Right - No Cyanotic nailbeds, Not Ischemic.  Neurologic Neurologic evaluation reveals -normal attention span and ability to concentrate, able to name objects and repeat phrases. Appropriate fund of knowledge , normal sensation and normal coordination. Mental Status Affect - not angry, not paranoid. Cranial Nerves-Normal Bilaterally. Gait-Normal.  Neuropsychiatric Mental status exam performed with findings of-able to articulate well with normal speech/language, rate, volume and coherence, thought content normal with ability to perform basic computations and apply abstract reasoning and no evidence of hallucinations, delusions, obsessions or homicidal/suicidal ideation.  Musculoskeletal Global Assessment Spine, Ribs and Pelvis - no instability, subluxation or laxity. Right Upper Extremity - no instability, subluxation or laxity.  Lymphatic Head & Neck  General Head & Neck Lymphatics: Bilateral - Description - No Localized lymphadenopathy. Axillary  General Axillary  Region: Bilateral - Description - No Localized lymphadenopathy. Femoral & Inguinal  Generalized Femoral & Inguinal Lymphatics: Left - Description - No Localized lymphadenopathy. Right - Description - No Localized lymphadenopathy.    Assessment & Plan Dan Holmes; 03/18/2016 10:37 AM)  CHRONIC CHOLECYSTITIS WITHOUT CALCULUS (K81.1) Impression: Postprandial epigastric pain with negative cardiopulmonary workup suspicious for chronic acalculous cholecystitis. No gallstones but HIDA scan shows cystic duct obstruction.  He's asymptomatic and not toxic right now. Would plan elective cholecystectomy.  Would like cardiac clearance given the fact he had myocardial infarction from his last elective back surgery 12 years ago. Recalls having a normal exercise treadmill test earlier in the year. Most likely will just need a letter verifying he can tolerate elective surgery.  Set there is some etiology for his pain, but his story is pretty classic for. We'll try and get LFTs if that does not been done since his admission since they were still elevated, although improved.  The patient was open to drive himself home after surgery. I cautioned him against that. Usually this can be an outpatient surgery unless his cardiologist is concerned. The gentleman does have excellent exercise tolerance is quite active. The patient and his wife agree with surgery.  Current Plans You are being scheduled for surgery - Our schedulers will call you.  You should hear from our office's scheduling department within 5 working days about the location, date, and time of surgery. We try to make accommodations for patient's preferences in scheduling surgery, but sometimes the OR schedule or the surgeon's schedule prevents [place] from making those accommodations.  If you have not heard from our office [phone]) in 5 working days, call the office and ask for your surgeon's nurse.  If you have other questions about your  diagnosis, plan, or surgery, call the office and ask for your surgeon's nurse.  The anatomy & physiology of hepatobiliary & pancreatic function was discussed. The pathophysiology of gallbladder dysfunction was discussed. Natural history risks without surgery was discussed. I feel the risks of no intervention will lead to serious problems that outweigh the operative risks; therefore, I recommended cholecystectomy to remove the pathology. I explained laparoscopic techniques with possible need for an open approach. Probable cholangiogram to evaluate the bilary tract was explained as well.  Risks such as bleeding, infection, abscess, leak, injury to other organs, need for further treatment, heart attack, death, and other risks  were discussed. I noted a good likelihood this will help address the problem. Possibility that this will not correct all abdominal symptoms was explained. Goals of post-operative recovery were discussed as well. We will work to minimize complications. An educational handout further explaining the pathology and treatment options was given as well. Questions were answered. The patient expresses understanding & wishes to proceed with surgery.  Pt Education - Pamphlet Given - Laparoscopic Gallbladder Surgery: discussed with patient and provided information. Written instructions provided Pt Education - [facility] Laparosopic Post Op HCI (Jaishon Krisher) Pt Education - [place] (Tanasia Budzinski) Pt Education - Laparoscopic Cholecystectomy: gallbladder I recommended obtaining preoperative cardiac clearance. I am concerned about the health of the patient and the ability to tolerate the operation. Therefore, we will request clearance by cardiology to better assess operative risk & see if a reevaluation, further workup, etc is needed. Also recommendations on how medications such as for anticoagulation and blood pressure should be managed/held/restarted after surgery. CHRONIC CONSTIPATION  (K59.09) Impression: Recommend a fiber bowel regimen. Consider Lizness more regularly has prior recommended.  Consider colonoscopy with history of colon constipation. While he is 66, he is very active and ones are very good he's got a few more decades of life and him.  Current Plans Pt Education - [facility] Constipation (AT) Pt Education - [facility] Good Bowel Health (Lesly Joslyn)  Dan Hector, M.D., F.A.C.S. Gastrointestinal and Minimally Invasive Surgery [facility] [address], [place], [place] [id number] [phone] Main / Paging

## 2016-03-27 DIAGNOSIS — I10 Essential (primary) hypertension: Secondary | ICD-10-CM | POA: Diagnosis not present

## 2016-03-27 DIAGNOSIS — E782 Mixed hyperlipidemia: Secondary | ICD-10-CM | POA: Diagnosis not present

## 2016-03-27 DIAGNOSIS — I2510 Atherosclerotic heart disease of native coronary artery without angina pectoris: Secondary | ICD-10-CM | POA: Diagnosis not present

## 2016-03-27 DIAGNOSIS — Z01810 Encounter for preprocedural cardiovascular examination: Secondary | ICD-10-CM | POA: Diagnosis not present

## 2016-04-15 DIAGNOSIS — Z79899 Other long term (current) drug therapy: Secondary | ICD-10-CM | POA: Diagnosis not present

## 2016-04-16 LAB — CBC AND DIFFERENTIAL
HEMATOCRIT: 40 % — AB (ref 41–53)
HEMOGLOBIN: 13.2 g/dL — AB (ref 13.5–17.5)
Platelets: 201 10*3/uL (ref 150–399)
WBC: 6.8 10^3/mL

## 2016-04-16 LAB — HEPATIC FUNCTION PANEL
ALK PHOS: 66 U/L (ref 25–125)
ALT: 10 U/L (ref 10–40)
AST: 13 U/L — AB (ref 14–40)
BILIRUBIN, TOTAL: 0.9 mg/dL

## 2016-04-16 LAB — BASIC METABOLIC PANEL
BUN: 13 mg/dL (ref 4–21)
Creatinine: 1.3 mg/dL (ref 0.6–1.3)
GLUCOSE: 118 mg/dL
Potassium: 5 mmol/L (ref 3.4–5.3)
SODIUM: 138 mmol/L (ref 137–147)

## 2016-04-25 DIAGNOSIS — I10 Essential (primary) hypertension: Secondary | ICD-10-CM | POA: Diagnosis not present

## 2016-04-25 DIAGNOSIS — E782 Mixed hyperlipidemia: Secondary | ICD-10-CM | POA: Diagnosis not present

## 2016-04-25 DIAGNOSIS — Z01810 Encounter for preprocedural cardiovascular examination: Secondary | ICD-10-CM | POA: Diagnosis not present

## 2016-04-25 DIAGNOSIS — I2510 Atherosclerotic heart disease of native coronary artery without angina pectoris: Secondary | ICD-10-CM | POA: Diagnosis not present

## 2016-05-20 DIAGNOSIS — Z23 Encounter for immunization: Secondary | ICD-10-CM | POA: Diagnosis not present

## 2016-06-14 ENCOUNTER — Other Ambulatory Visit: Payer: Self-pay | Admitting: Rheumatology

## 2016-06-14 DIAGNOSIS — Z79899 Other long term (current) drug therapy: Secondary | ICD-10-CM | POA: Diagnosis not present

## 2016-06-15 LAB — CBC WITH DIFFERENTIAL/PLATELET
Basophils Absolute: 0 10*3/uL (ref 0.0–0.2)
Basos: 0 %
EOS (ABSOLUTE): 0.1 10*3/uL (ref 0.0–0.4)
EOS: 2 %
HEMATOCRIT: 38.8 % (ref 37.5–51.0)
HEMOGLOBIN: 13 g/dL (ref 12.6–17.7)
IMMATURE GRANS (ABS): 0 10*3/uL (ref 0.0–0.1)
Immature Granulocytes: 0 %
LYMPHS ABS: 1.1 10*3/uL (ref 0.7–3.1)
LYMPHS: 16 %
MCH: 30.2 pg (ref 26.6–33.0)
MCHC: 33.5 g/dL (ref 31.5–35.7)
MCV: 90 fL (ref 79–97)
MONOCYTES: 8 %
Monocytes Absolute: 0.5 10*3/uL (ref 0.1–0.9)
NEUTROS ABS: 4.9 10*3/uL (ref 1.4–7.0)
Neutrophils: 74 %
Platelets: 194 10*3/uL (ref 150–379)
RBC: 4.31 x10E6/uL (ref 4.14–5.80)
RDW: 14.7 % (ref 12.3–15.4)
WBC: 6.7 10*3/uL (ref 3.4–10.8)

## 2016-06-15 LAB — COMPREHENSIVE METABOLIC PANEL
ALBUMIN: 4.7 g/dL (ref 3.5–4.7)
ALK PHOS: 72 IU/L (ref 39–117)
ALT: 12 IU/L (ref 0–44)
AST: 16 IU/L (ref 0–40)
Albumin/Globulin Ratio: 2.1 (ref 1.2–2.2)
BUN / CREAT RATIO: 16 (ref 10–24)
BUN: 24 mg/dL (ref 8–27)
Bilirubin Total: 0.7 mg/dL (ref 0.0–1.2)
CHLORIDE: 101 mmol/L (ref 96–106)
CO2: 23 mmol/L (ref 18–29)
CREATININE: 1.5 mg/dL — AB (ref 0.76–1.27)
Calcium: 9.7 mg/dL (ref 8.6–10.2)
GFR, EST AFRICAN AMERICAN: 50 mL/min/{1.73_m2} — AB (ref >59)
GFR, EST NON AFRICAN AMERICAN: 43 mL/min/{1.73_m2} — AB (ref >59)
GLOBULIN, TOTAL: 2.2 g/dL (ref 1.5–4.5)
Glucose: 174 mg/dL — ABNORMAL HIGH (ref 65–99)
POTASSIUM: 4.8 mmol/L (ref 3.5–5.2)
SODIUM: 141 mmol/L (ref 134–144)
TOTAL PROTEIN: 6.9 g/dL (ref 6.0–8.5)

## 2016-06-18 ENCOUNTER — Telehealth: Payer: Self-pay | Admitting: Radiology

## 2016-06-18 NOTE — Telephone Encounter (Signed)
I have gotten labs patients Glucose is elevated His Creat. Is also more elevated 1.50 previously has been about 1.2 range, his GFR is declined to 43 previously this has been in the 50's range  Methotrexate 4 per week, please advise. I have placed the labs on your desk for review. They are from [place] will send for scanning

## 2016-06-18 NOTE — Telephone Encounter (Signed)
Plesaedecrease MTX to 3 tabs po qweek. Will recheck BMP on return.

## 2016-06-18 NOTE — Telephone Encounter (Signed)
Thank you, I have called him to advise

## 2016-06-19 ENCOUNTER — Encounter: Payer: Self-pay | Admitting: *Deleted

## 2016-06-19 DIAGNOSIS — Z79899 Other long term (current) drug therapy: Secondary | ICD-10-CM | POA: Insufficient documentation

## 2016-06-19 DIAGNOSIS — G545 Neuralgic amyotrophy: Secondary | ICD-10-CM

## 2016-06-19 DIAGNOSIS — N200 Calculus of kidney: Secondary | ICD-10-CM

## 2016-06-19 DIAGNOSIS — L409 Psoriasis, unspecified: Secondary | ICD-10-CM

## 2016-06-19 DIAGNOSIS — M5030 Other cervical disc degeneration, unspecified cervical region: Secondary | ICD-10-CM

## 2016-06-19 DIAGNOSIS — M51369 Other intervertebral disc degeneration, lumbar region without mention of lumbar back pain or lower extremity pain: Secondary | ICD-10-CM

## 2016-06-19 DIAGNOSIS — M810 Age-related osteoporosis without current pathological fracture: Secondary | ICD-10-CM

## 2016-06-19 DIAGNOSIS — M5136 Other intervertebral disc degeneration, lumbar region: Secondary | ICD-10-CM

## 2016-06-19 DIAGNOSIS — M1A9XX0 Chronic gout, unspecified, without tophus (tophi): Secondary | ICD-10-CM

## 2016-06-19 HISTORY — DX: Other intervertebral disc degeneration, lumbar region without mention of lumbar back pain or lower extremity pain: M51.369

## 2016-06-19 HISTORY — DX: Age-related osteoporosis without current pathological fracture: M81.0

## 2016-06-19 HISTORY — DX: Psoriasis, unspecified: L40.9

## 2016-06-19 HISTORY — DX: Other cervical disc degeneration, unspecified cervical region: M50.30

## 2016-06-19 HISTORY — DX: Chronic gout, unspecified, without tophus (tophi): M1A.9XX0

## 2016-06-19 HISTORY — DX: Other intervertebral disc degeneration, lumbar region: M51.36

## 2016-06-19 HISTORY — DX: Other long term (current) drug therapy: Z79.899

## 2016-06-19 HISTORY — DX: Neuralgic amyotrophy: G54.5

## 2016-06-19 HISTORY — DX: Calculus of kidney: N20.0

## 2016-06-19 NOTE — Progress Notes (Signed)
*IMAGE* Office Visit Note  Patient: Dan Holmes             Date of Birth: 1931/12/31           MRN: [id number]             PCP: Ann Held, MD Referring: Myer Peer, MD Visit Date: 06/20/2016 Occupation:@GUAROCC @    Subjective:  Follow-up PsA & Ps & HRRX  History of Present Illness: Dan Holmes is a 82 y.o. male last seen 02/13/2016. Patient states that he is doing well with disorder arthritis and high risk prescription and psoriasis. He is also doing well with his gout. Patient states that his kidney functions was a little abnormal so we cut his methotrexate down from 4 pills every week to 3 pills every week.  He has gallbladder surgery coming up this Monday, 06/24/2016. As a result he is stopped taking his methotrexate as advised by her office. He will restart the methotrexate after his doctor clears him that his surgery is going well and that there are no infections. A week after that information he will restart his methotrexate. Currently he is doing well with everything and he does not need any refills at this time.  He is doing well with his gout and has been taking allopurinol 300 mg every day with no problems. Observing proper diet and exercise   Patient states that in July 2017 and he was having substernal chest pains. He came to [facility] where they did a workup for him. They did not find anything on a stress test and released him after 4 days. Since then, the patient continues to do well.  Activities of Daily Living:  Patient reports morning stiffness for 15 minutes.   Patient Denies nocturnal pain.  Difficulty dressing/grooming: Denies Difficulty climbing stairs: Denies Difficulty getting out of chair: Denies Difficulty using hands for taps, buttons, cutlery, and/or writing: Denies   Review of Systems  Constitutional: Negative for fatigue.  HENT: Negative for mouth sores and mouth dryness.   Eyes: Negative for dryness.  Respiratory:  Negative for shortness of breath.   Gastrointestinal: Negative for constipation and diarrhea.  Musculoskeletal: Negative for myalgias and myalgias.  Skin: Negative for sensitivity to sunlight.  Neurological: Negative for memory loss.  Psychiatric/Behavioral: Negative for sleep disturbance.    PMFS History:  Patient Active Problem List   Diagnosis Date Noted  . Psoriasis 06/19/2016  . DDD (degenerative disc disease), lumbar 06/19/2016  . DDD (degenerative disc disease), cervical 06/19/2016  . Osteoporosis 06/19/2016  . Kidney stones 06/19/2016  . 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 High risk medications (not anticoagulants) long-term use 06/19/2016  . Chronic gout without tophus 06/19/2016  . Abnormal LFTs   . Pancreatitis, acute   . Pancreatitis 01/27/2016  . Abdominal pain 01/27/2016  . Cough due to ACE inhibitor 12/16/2014  . Dyslipidemia 05/28/2013  . Ejection fraction   . Psoriatic arthritis ([place])   . CAD (coronary artery disease)   . Renal insufficiency   . Diabetes mellitus   . HTN (hypertension), benign   . Hx of CABG   . Carotid artery disease ([place])   . Pain 02/16/2010    Past Medical History:  Diagnosis Date  . Arthritis   . ASCVD (arteriosclerotic cardiovascular disease)   . CAD (coronary artery disease)    January, 2012, nuclear, EF 61%, normal  . Carotid artery disease ([place])    Doppler, August, 2011, [id number] RIC A., [id number] LICA, followup one  year  . Carpal tunnel syndrome   . DDD (degenerative disc disease), cervical 06/19/2016  . DDD (degenerative disc disease), lumbar 06/19/2016   Spinal Fusion  . Diabetes mellitus   . Ejection fraction   . Gout   . HTN (hypertension), benign   . Hx of CABG    2005  . Hyperlipidemia   . Kidney stones 06/19/2016  . Osteoporosis 06/19/2016  . Pain 02/2010   right neck, right shoulder, right wrist, right leg-not cardiac  . 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 Psoriasis 06/19/2016  . Psoriatic arthritis ([place])     2012  . Renal insufficiency     Family History  Problem Relation Age of Onset  . CVA Mother   . Heart attack Father   . Heart disease Brother    Past Surgical History:  Procedure Laterality Date  . CORONARY ARTERY BYPASS GRAFT  2005  . CORONARY ARTERY BYPASS GRAFT    . SPINAL FUSION     Social History   Social History Narrative   Level of education: High school    Employment: retired, [employer] maintenance            Objective: Vital Signs: BP (!) 149/65 (BP Location: Left Arm, Patient Position: Standing, Cuff Size: Large)   Pulse 68   Resp 14   Ht 5\' 10"  (1.778 m)   Wt 182 lb (82.6 kg)   BMI 26.11 kg/m    Physical Exam  Constitutional: He is oriented to person, place, and time. He appears well-developed and well-nourished.  HENT:  Head: Normocephalic and atraumatic.  Eyes: Conjunctivae and EOM are normal. Pupils are equal, round, and reactive to light.  Neck: Normal range of motion. Neck supple.  Cardiovascular: Normal rate, regular rhythm and normal heart sounds.  Exam reveals no gallop and no friction rub.   No murmur heard. Pulmonary/Chest: Effort normal and breath sounds normal. No respiratory distress. He has no wheezes. He has no rales. He exhibits no tenderness.  Abdominal: Soft. He exhibits no distension and no mass. There is no tenderness. There is no guarding.  Musculoskeletal: Normal range of motion.  Lymphadenopathy:    He has no cervical adenopathy.  Neurological: He is alert and oriented to person, place, and time. He exhibits normal muscle tone. Coordination normal.  Skin: Skin is warm and dry. Capillary refill takes less than 2 seconds. No rash noted.  Psychiatric: He has a normal mood and affect. His behavior is normal. Judgment and thought content normal.  Nursing note and vitals reviewed.    Musculoskeletal Exam:   Full range of motion of all joints except decreased range of motion of bilateral shoulder joint with 120 of abduction of the right  shoulder and 110 of the left shoulder Grip strength is equal and strong bilaterally Fibromyalgia tender points are all absent  CDAI Exam: No CDAI exam completed.  There is no synovitis on examination. Patient has ongoing enlargement of his first MCP joint bilaterally but no pain.  Investigation: Findings:  At 1055, I was given the patient's labs from 06/14/2016 that were done at [facility]. CMP with GFR shows elevated glucose at 174, elevated creatinine at 1.50, decreased GFR at 43. As a result we have decreased his methotrexate to 3 per week. We will see how well the patient is responding to this decrease amount of methotrexate at the next labs to be done in 2 months.  CBC with differential on 06/14/2016 at [facility] was normal.  Recent labs done  for his gallbladder Surgery on 06/24/2016 will be faxed to our office according to the patient. Patient declined getting labs in our office today.  Imaging: No results found.  Speciality Comments: No specialty comments available.    Procedures:  No procedures performed Allergies: Sulfa antibiotics   Assessment / Plan: Visit Diagnoses: Psoriatic arthritis ([place])  High risk medications (not anticoagulants) long-term use - Plan: CBC with Differential/Platelet, COMPLETE METABOLIC PANEL WITH GFR  Chronic gout without tophus, unspecified cause, unspecified site - Plan: Uric acid  Osteoporosis, unspecified osteoporosis type, unspecified pathological fracture presence  Cholecystitis - NO STONES NOTED ON U.S. per patient.Has surger scheduled for Monday, Jun 24, 2016.Had recent labs for upcomning surgery and will mail copy to [place].   We will order CBC with differential CMP with GFR every 2 months to 3 months starting today and then will do uric acid today. Despite this plan to draw the blood today, patient is getting his gallbladder gallbladder surgery this coming Monday, November 6 and has had recent blood draw. Patient feels pretty good that his  CBC and CMP were checked at that time. We will have him send those lab reports to her office so we can keep up and see how everything is going. Patient declined labs in our office today since he had recently done them. His uric acid was within desirable range on June 2017 at 4.9. Since he has not had any flares and he refuses to get labs today I don't mind waiting until couple of months to get everything done. I written order accordingly.  Note that patient was having flare of symptoms of pain in the right upper quadrant when he ate fatty foods. It was decided that getting the gallbladder out since is not working well will help with this. Also note that may be his chest pain that he had stress test which came back negative may also be helped with the gallbladder removal.  I will refill his methotrexate 3 per week,( folic acid 1 mg every day for 90 day supply with 4 refills), allopurinol 300 mg every day 90 day supply with one refill.  Patient sees Dr. Nicki [facility]aper at [facility]  Patient is getting adequate response from methotrexate. If patient is agreeable we can do his labs every 2-3 months.  We will see the patient back in 5 months  Orders: Orders Placed This Encounter  Procedures  . CBC with Differential/Platelet  . COMPLETE METABOLIC PANEL WITH GFR  . Uric acid   Meds ordered this encounter  Medications  . methotrexate (RHEUMATREX) 2.5 MG tablet    Sig: Take 3 tablets (7.5 mg total) by mouth every Wednesday. Caution:Chemotherapy. Protect from light.    Dispense:  36 tablet    Refill:  0  . allopurinol (ZYLOPRIM) 300 MG tablet    Sig: Take 1 tablet (300 mg total) by mouth daily.    Dispense:  90 tablet    Refill:  1  . folic acid (FOLVITE) 1 MG tablet    Sig: Take 1 tablet (1 mg total) by mouth daily.    Dispense:  90 tablet    Refill:  4    Face-to-face time spent with patient was 30 minutes. 50% of time was spent in counseling and coordination of care.  Follow-Up Instructions:  Return in about 5 months (around 11/18/2016) for PsA, Ps, HRRX (Mtx 3/week), Folic 1mg  QD, UricAcid Levels, Gallbladder surgery NOv 2017.     I examined and evaluated the patient with [facility]  Antwyne Pingree PA. The plan of care was discussed as noted above.  Bo Merino, MD

## 2016-06-20 ENCOUNTER — Ambulatory Visit (INDEPENDENT_AMBULATORY_CARE_PROVIDER_SITE_OTHER): Payer: Commercial Managed Care - HMO | Admitting: Rheumatology

## 2016-06-20 ENCOUNTER — Encounter: Payer: Self-pay | Admitting: Rheumatology

## 2016-06-20 VITALS — BP 149/65 | HR 68 | Resp 14 | Ht 70.0 in | Wt 182.0 lb

## 2016-06-20 DIAGNOSIS — M810 Age-related osteoporosis without current pathological fracture: Secondary | ICD-10-CM

## 2016-06-20 DIAGNOSIS — L4050 Arthropathic psoriasis, unspecified: Secondary | ICD-10-CM | POA: Diagnosis not present

## 2016-06-20 DIAGNOSIS — K819 Cholecystitis, unspecified: Secondary | ICD-10-CM | POA: Diagnosis not present

## 2016-06-20 DIAGNOSIS — M1A9XX0 Chronic gout, unspecified, without tophus (tophi): Secondary | ICD-10-CM | POA: Diagnosis not present

## 2016-06-20 DIAGNOSIS — Z79899 Other long term (current) drug therapy: Secondary | ICD-10-CM

## 2016-06-20 MED ORDER — METHOTREXATE 2.5 MG PO TABS: 7.5000 mg | ORAL_TABLET | ORAL | 0 refills | Status: DC

## 2016-06-20 MED ORDER — FOLIC ACID 1 MG PO TABS: 1.0000 mg | ORAL_TABLET | Freq: Every day | ORAL | 4 refills | Status: DC

## 2016-06-20 MED ORDER — ALLOPURINOL 300 MG PO TABS: 300.0000 mg | ORAL_TABLET | Freq: Every day | ORAL | 1 refills | Status: DC

## 2016-06-24 ENCOUNTER — Other Ambulatory Visit: Payer: Self-pay | Admitting: Surgery

## 2016-06-24 ENCOUNTER — Telehealth: Payer: Self-pay | Admitting: Radiology

## 2016-06-24 DIAGNOSIS — K429 Umbilical hernia without obstruction or gangrene: Secondary | ICD-10-CM | POA: Diagnosis not present

## 2016-06-24 DIAGNOSIS — K811 Chronic cholecystitis: Secondary | ICD-10-CM | POA: Diagnosis not present

## 2016-06-24 DIAGNOSIS — K8010 Calculus of gallbladder with chronic cholecystitis without obstruction: Secondary | ICD-10-CM | POA: Diagnosis not present

## 2016-06-24 NOTE — Telephone Encounter (Signed)
Call patient regarding lab results.

## 2016-06-27 NOTE — Telephone Encounter (Signed)
Advised patient of low kidney function, GFR, creat elevated, told him to follow up with Dr Nicki Reaper, faxed there (PCP)

## 2016-07-28 IMAGING — US US ABDOMEN LIMITED
1 series · 14 of 25 positions shown · non-contrast
Comparison: None.

CLINICAL DATA: Upper abdominal pain for 1 day

EXAM:
US ABDOMEN LIMITED - RIGHT UPPER QUADRANT

[Series 1: us abdomen limited · 0.15mm/px · 14 of 39 slices shown]
[im 1/39]
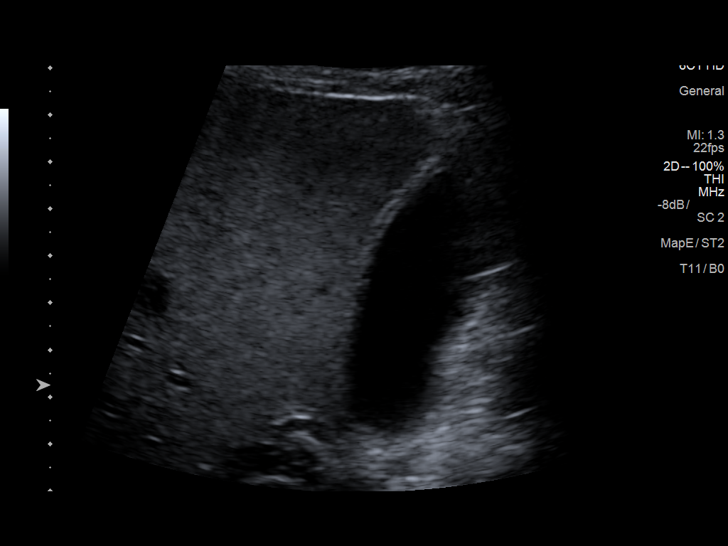
[im 4/39]
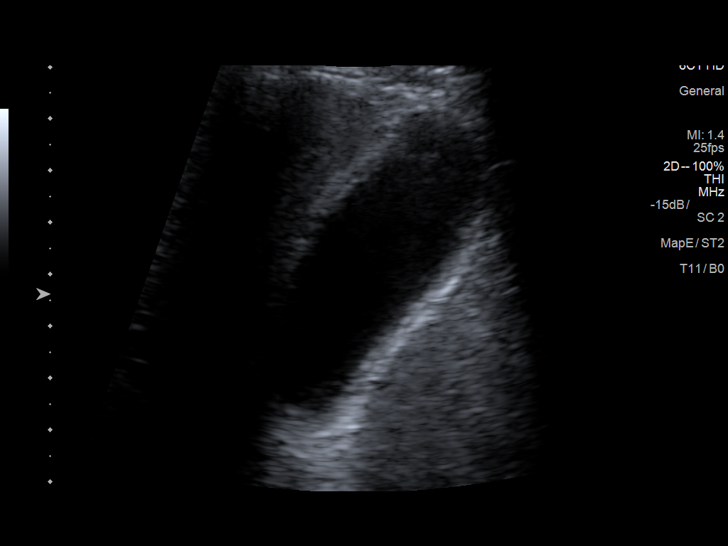
[im 7/39]
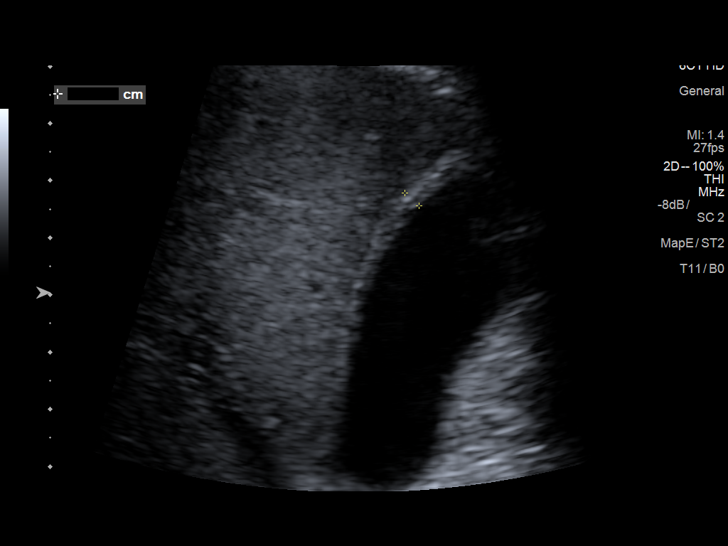
[im 10/39]
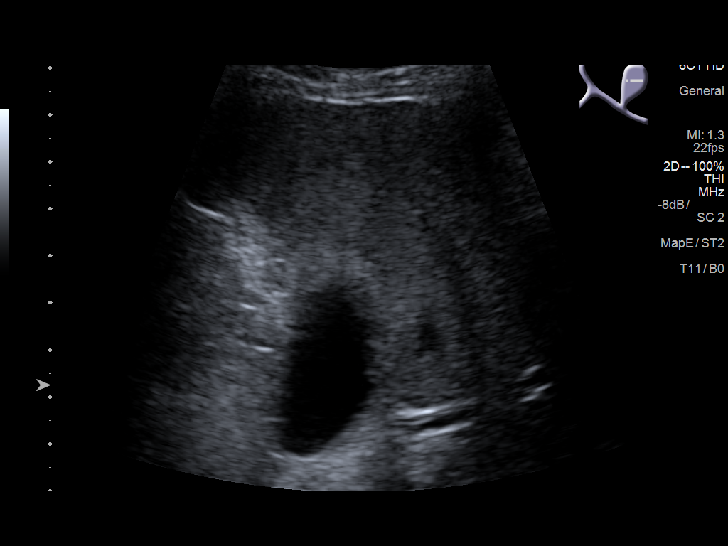
[im 13/39]
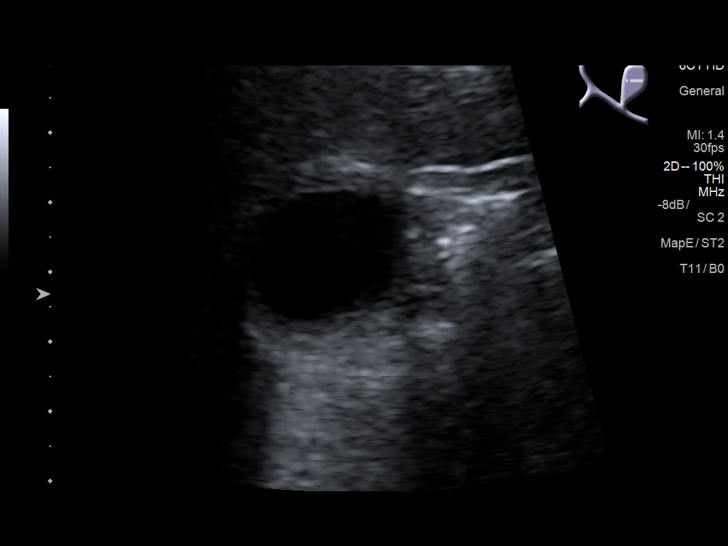
[im 15/39]
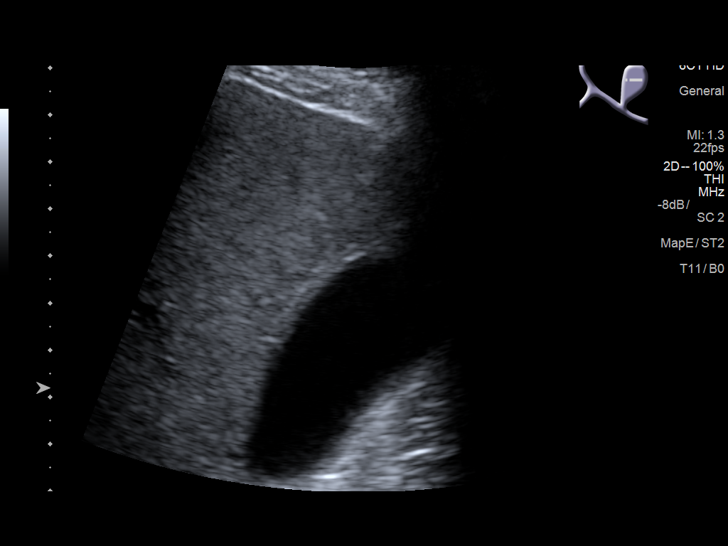
[im 18/39]
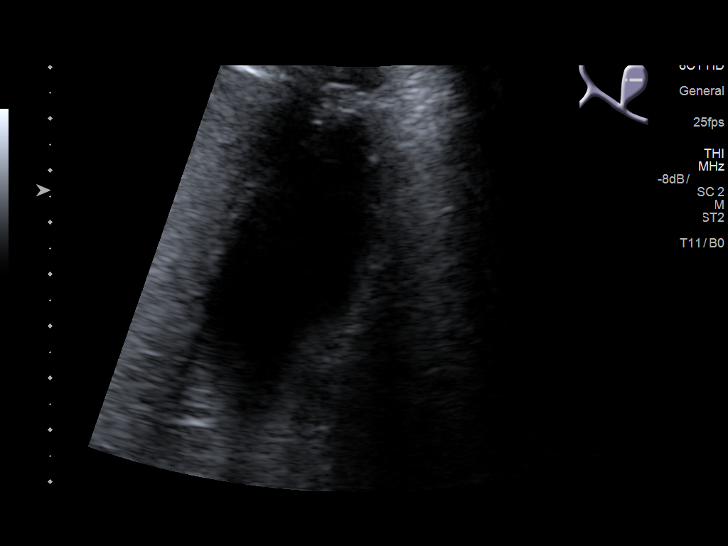
[im 21/39]
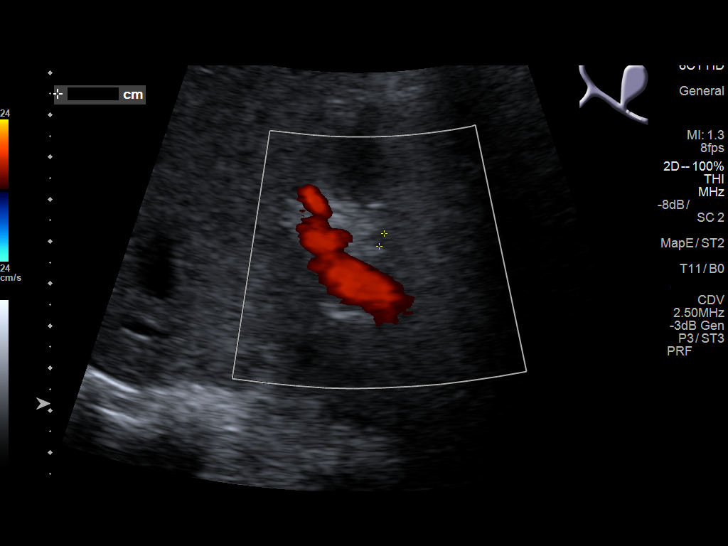
[im 24/39]
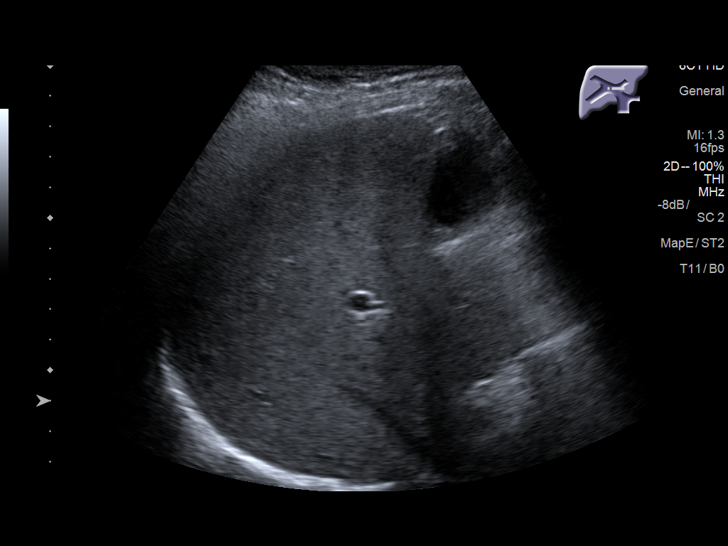
[im 26/39]
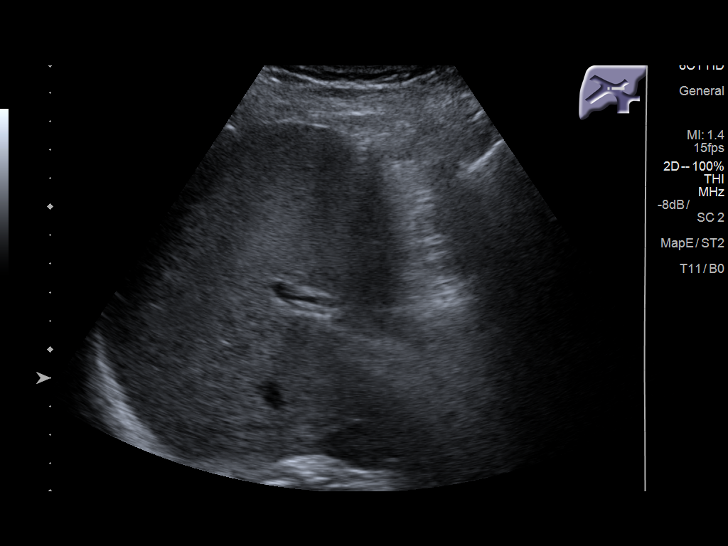
[im 29/39]
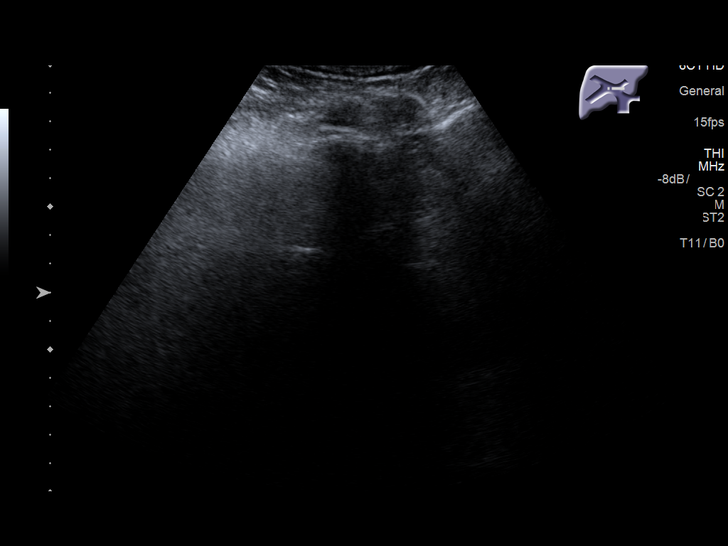
[im 32/39]
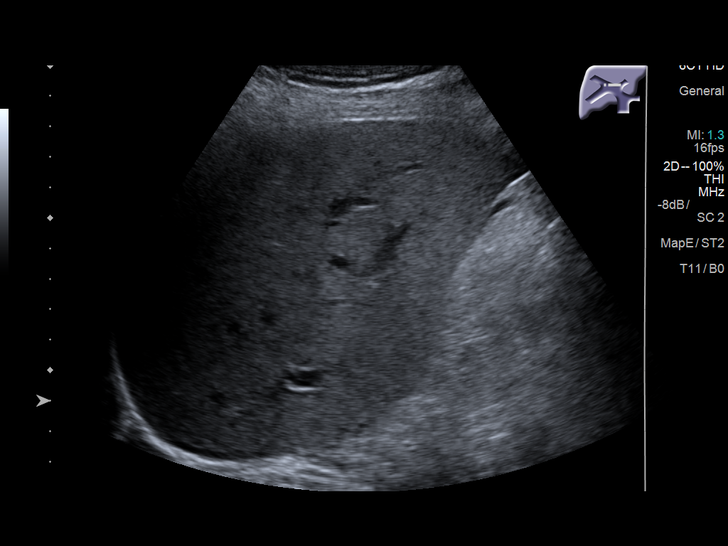
[im 35/39]
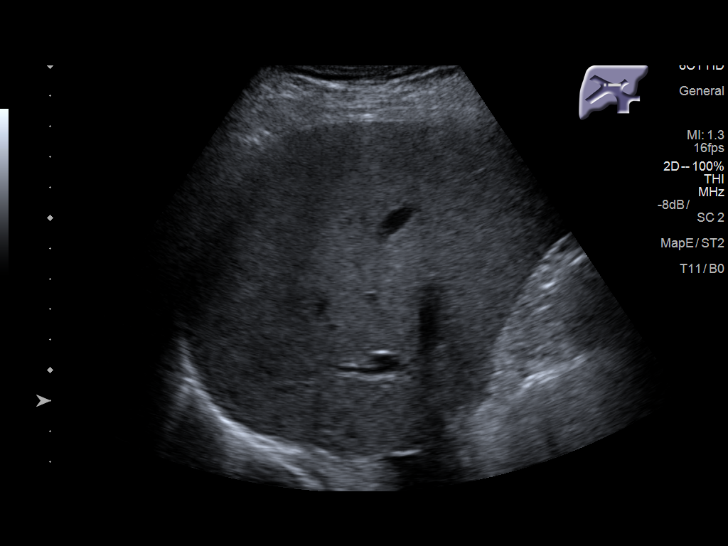
[im 39/39]
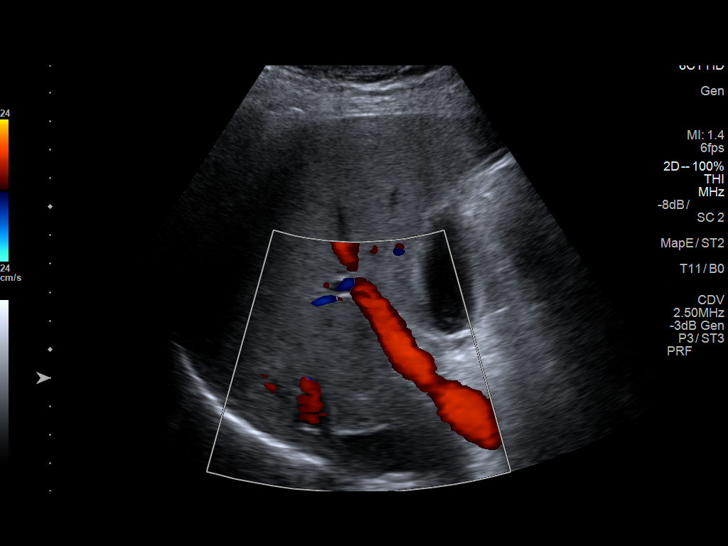

[14 of 25 positions shown; findings below may reference images not displayed]

FINDINGS: Gallbladder:

3.3 mm gallbladder wall.  No stones.  No Murphy sign.

Common bile duct:

Diameter: 3 mm

Liver:

No focal abnormalities.  Mildly increased echogenicity.
IMPRESSION: Mild hepatic parenchymal disease likely steatosis.

Minimal gallbladder wall thickening, a nonspecific finding.

## 2016-09-02 DIAGNOSIS — E559 Vitamin D deficiency, unspecified: Secondary | ICD-10-CM | POA: Diagnosis not present

## 2016-09-02 DIAGNOSIS — E1129 Type 2 diabetes mellitus with other diabetic kidney complication: Secondary | ICD-10-CM | POA: Diagnosis not present

## 2016-09-02 DIAGNOSIS — M109 Gout, unspecified: Secondary | ICD-10-CM | POA: Diagnosis not present

## 2016-09-06 DIAGNOSIS — N183 Chronic kidney disease, stage 3 (moderate): Secondary | ICD-10-CM | POA: Diagnosis not present

## 2016-09-06 DIAGNOSIS — R809 Proteinuria, unspecified: Secondary | ICD-10-CM | POA: Diagnosis not present

## 2016-09-06 DIAGNOSIS — E1129 Type 2 diabetes mellitus with other diabetic kidney complication: Secondary | ICD-10-CM | POA: Diagnosis not present

## 2016-09-06 DIAGNOSIS — Z23 Encounter for immunization: Secondary | ICD-10-CM | POA: Diagnosis not present

## 2016-09-24 ENCOUNTER — Other Ambulatory Visit: Payer: Self-pay | Admitting: Rheumatology

## 2016-09-24 MED ORDER — METHOTREXATE 2.5 MG PO TABS: 7.5000 mg | ORAL_TABLET | ORAL | 0 refills | Status: DC

## 2016-09-24 MED ORDER — FOLIC ACID 1 MG PO TABS: 1.0000 mg | ORAL_TABLET | Freq: Every day | ORAL | 4 refills | Status: DC

## 2016-09-24 NOTE — Telephone Encounter (Signed)
Last Visit: 06/20/16 Next Visit: 11/18/16 Labs: 09/02/16 Creat. 1.30 GFR: 56  Okay to refill MTX and Folic Acid?

## 2016-09-24 NOTE — Telephone Encounter (Signed)
Patient's labs were brought in and we will scan him into his chart.I was able to review them.His current labs are better than the previous labs.It is okay to refill his methotrexate.I have approved his medicines already

## 2016-09-24 NOTE — Telephone Encounter (Signed)
Last Visit: 06/20/16 Next Visit: 11/18/16 Labs: 06/14/16 Creat 1.50 GFR 43  Attempted to contact the patient about labs unable to leave a message.

## 2016-09-24 NOTE — Telephone Encounter (Signed)
Patient is requesting refills of MTX (4 pills once a week) and folic acid (1 a day). Please send to [facility].

## 2016-09-25 ENCOUNTER — Other Ambulatory Visit: Payer: Self-pay | Admitting: *Deleted

## 2016-10-03 ENCOUNTER — Other Ambulatory Visit: Payer: Self-pay | Admitting: *Deleted

## 2016-10-03 ENCOUNTER — Other Ambulatory Visit: Payer: Self-pay | Admitting: Rheumatology

## 2016-10-03 DIAGNOSIS — I2510 Atherosclerotic heart disease of native coronary artery without angina pectoris: Secondary | ICD-10-CM | POA: Diagnosis not present

## 2016-10-03 DIAGNOSIS — E785 Hyperlipidemia, unspecified: Secondary | ICD-10-CM | POA: Diagnosis not present

## 2016-10-03 DIAGNOSIS — I10 Essential (primary) hypertension: Secondary | ICD-10-CM | POA: Diagnosis not present

## 2016-10-03 MED ORDER — METHOTREXATE 2.5 MG PO TABS: 7.5000 mg | ORAL_TABLET | ORAL | 0 refills | Status: DC

## 2016-10-03 MED ORDER — FOLIC ACID 1 MG PO TABS: 1.0000 mg | ORAL_TABLET | Freq: Every day | ORAL | 4 refills | Status: DC

## 2016-10-03 NOTE — Telephone Encounter (Signed)
Prescriptions for MTX and Folic Acid were okayed and were sent to the incorrect pharmacy. Resent the prescription to [facility] for the patient.

## 2016-10-03 NOTE — Telephone Encounter (Signed)
Patient wants to know why he can not refill his methotrexate prescription.  [facility]  [phone]

## 2016-10-10 NOTE — Telephone Encounter (Signed)
Patient called stating that the [facility] has not filled his prescription because they need some clarification with his prescription.  [phone].  Thank you.

## 2016-10-10 NOTE — Telephone Encounter (Signed)
Patient advised Dan Holmes has his prescription and has shipped his medicine. He should receive it in 7-10 days.

## 2016-10-17 DIAGNOSIS — E119 Type 2 diabetes mellitus without complications: Secondary | ICD-10-CM | POA: Diagnosis not present

## 2016-11-13 DIAGNOSIS — K819 Cholecystitis, unspecified: Secondary | ICD-10-CM

## 2016-11-13 HISTORY — DX: Cholecystitis, unspecified: K81.9

## 2016-11-13 NOTE — Progress Notes (Addendum)
Office Visit Note  Patient: Dan Holmes             Date of Birth: [birth year]           MRN: [id number]             PCP: Ann Held, MD Referring: Myer Peer, MD Visit Date: 11/19/2016 Occupation: @GUAROCC @    Subjective:  Follow-up   History of Present Illness: Dan Holmes is a 83 y.o. male   Last seen 5 months ago. Patient states that he is doing really well with all of his joints as it relates to psoriatic arthritis and gout. He's been taking his medications as prescribed. He takes methotrexate 2.5 mg, 3 pills once a week as well as folic acid 1 mg daily. He also takes his allopurinol 300 mg on a daily basis. He does have colchicine at home which he takes when he has a flare. He states that he has not no flare of gout nor has he had any flare of psoriatic arthritis.  Patient's left eye in the lateral aspect in the sclera has redness. When I inquire where that came from, he reports that he was chasing a red snake through the woods yesterday and a tweak muscle struck his eye. Until I mention it, he was not aware of the injury to the left eye. He states that it is not painful. It is important note that there is a long-standing pterygium to the lateral aspect of the left eye.  Also note that he has prominence of his first MCP joint bilaterally. The left is worse than the right. It is not painful but it is enlarged and he finds it uncomfortable when he is removing his shirt.  He also reports that he had laparoscopic cholecystectomy November 2017  Activities of Daily Living:  Patient reports morning stiffness for 15 minutes.   Patient Denies nocturnal pain.  Difficulty dressing/grooming: Denies Difficulty climbing stairs: Denies Difficulty getting out of chair: Denies Difficulty using hands for taps, buttons, cutlery, and/or writing: Denies   Review of Systems  Constitutional: Negative for fatigue.  HENT: Negative for mouth sores and mouth dryness.    Eyes: Negative for dryness.  Respiratory: Negative for shortness of breath.   Gastrointestinal: Negative for constipation and diarrhea.  Musculoskeletal: Negative for myalgias and myalgias.  Skin: Negative for sensitivity to sunlight.  Neurological: Negative for memory loss.  Psychiatric/Behavioral: Negative for sleep disturbance.    PMFS History:  Patient Active Problem List   Diagnosis Date Noted  . Cholecystitis 11/13/2016  . Psoriasis 06/19/2016  . DDD (degenerative disc disease), lumbar 06/19/2016  . DDD (degenerative disc disease), cervical 06/19/2016  . Osteoporosis 06/19/2016  . Kidney stones 06/19/2016  . 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 High risk medications (not anticoagulants) long-term use 06/19/2016  . Chronic gout without tophus 06/19/2016  . Abnormal LFTs   . Pancreatitis, acute   . Pancreatitis 01/27/2016  . Abdominal pain 01/27/2016  . Cough due to ACE inhibitor 12/16/2014  . Dyslipidemia 05/28/2013  . Ejection fraction   . Psoriatic arthritis ([place])   . CAD (coronary artery disease)   . Renal insufficiency   . Diabetes mellitus   . HTN (hypertension), benign   . Hx of CABG   . Carotid artery disease ([place])   . Pain 02/16/2010    Past Medical History:  Diagnosis Date  . Arthritis   . ASCVD (arteriosclerotic cardiovascular disease)   . CAD (coronary artery disease)  January, 2012, nuclear, EF 61%, normal  . Carotid artery disease ([place])    Doppler, August, 2011, 40-98% RIC A., 1-19% LICA, followup one year  . Carpal tunnel syndrome   . DDD (degenerative disc disease), cervical 06/19/2016  . DDD (degenerative disc disease), lumbar 06/19/2016   Spinal Fusion  . Diabetes mellitus   . Ejection fraction   . Gout   . HTN (hypertension), benign   . Hx of CABG    2005  . Hyperlipidemia   . Kidney stones 06/19/2016  . Osteoporosis 06/19/2016  . Pain 02/2010   right neck, right shoulder, right wrist, right leg-not cardiac  . Parsonage-Turner  syndrome 06/19/2016  . Psoriasis 06/19/2016  . Psoriatic arthritis ([place])    2012  . Renal insufficiency     Family History  Problem Relation Age of Onset  . CVA Mother   . Heart attack Father   . Heart disease Brother    Past Surgical History:  Procedure Laterality Date  . CORONARY ARTERY BYPASS GRAFT  2005  . CORONARY ARTERY BYPASS GRAFT    . SPINAL FUSION    Patient had gallbladder removed approximately November 2017. Surgery went well. They did laparoscopic surgery. Had 2 small stones removed. Gallbladder was removed. The bile did not look too good according to the patient. Patient did really well after the surgery   Social History   Social History Narrative   Level of education: High school    Employment: retired, [employer] maintenance            Objective: Vital Signs: BP (!) 142/80   Pulse 78   Resp 16   Wt 188 lb (85.3 kg)   BMI 26.98 kg/m    Physical Exam  Constitutional: He is oriented to person, place, and time. He appears well-developed and well-nourished.  HENT:  Head: Normocephalic and atraumatic.  Eyes: Conjunctivae and EOM are normal. Pupils are equal, round, and reactive to light.  Neck: Normal range of motion. Neck supple.  Cardiovascular: Normal rate, regular rhythm and normal heart sounds.  Exam reveals no gallop and no friction rub.   No murmur heard. Pulmonary/Chest: Effort normal and breath sounds normal. No respiratory distress. He has no wheezes. He has no rales. He exhibits no tenderness.  Abdominal: Soft. He exhibits no distension and no mass. There is no tenderness. There is no guarding.  Musculoskeletal: Normal range of motion.  Lymphadenopathy:    He has no cervical adenopathy.  Neurological: He is alert and oriented to person, place, and time. He exhibits normal muscle tone. Coordination normal.  Skin: Skin is warm and dry. Capillary refill takes less than 2 seconds. No rash noted.  Psychiatric: He has a normal mood and affect. His  behavior is normal. Judgment and thought content normal.  Vitals reviewed.    Musculoskeletal Exam:  Full range of motion of all joints Grip strength is equal and strong bilaterally Fiber myalgia tender points are all absent. Note that when patient squats down he is not able to squat as easily as he used to in the past. This is consistent with osteoarthritis of the knees  CDAI Exam: CDAI Homunculus Exam:   Swelling:  Right hand: 1st MCP and 2nd MCP Left hand: 1st MCP and 2nd MCP  Joint Counts:  CDAI Tender Joint count: 0 CDAI Swollen Joint count: 4  Global Assessments:  Patient Global Assessment: 1 Provider Global Assessment: 1  CDAI Calculated Score: 6 Bilateral first MCP joint are enlarged but no  swelling. Consistent with osteoarthritis. Bilateral second MCP with swelling but no synovitis. Consistent with   Investigation: Findings:  CMP with GFR shows elevated glucose at 174, elevated creatinine at 1.50, decreased GFR at 43. As a result we have decreased his methotrexate to 3 per week. We will see how well the patient is responding to this decrease amount of methotrexate at the next labs to be done in 2 months.  CBC with differential on 06/14/2016 at lab core was normal  Patient is getting adequate response from methotrexate  Review of patient's chart reveals scanned documents from 09/02/2016 at [facility] practice. Lipid panel Hemoglobin A1c CBC from 09/02/2016 shows normal white count and normal hemoglobin and hematocrit CMP with GFR done 09/02/2016 shows creatinine at 1.30 Normal liver function GFR is 56 and slightly low. Patient has a history of low kidney function but we will monitor. Uric acid is 5.8 and in the desirable range.  Abstract on 06/19/2016  Component Date Value Ref Range Status  . Hemoglobin 04/16/2016 13.2* 13.5 - 17.5 g/dL Final  . HCT 04/16/2016 40* 41 - 53 % Final  . Platelets 04/16/2016 201  150 - 399 K/L Final  . WBC 04/16/2016 6.8   10^3/mL Final  . Glucose 04/16/2016 118  mg/dL Final  . BUN 04/16/2016 13  4 - 21 mg/dL Final  . Creatinine 04/16/2016 1.3  0.6 - 1.3 mg/dL Final  . Potassium 04/16/2016 5.0  3.4 - 5.3 mmol/L Final  . Sodium 04/16/2016 138  137 - 147 mmol/L Final  . Alkaline Phosphatase 04/16/2016 66  25 - 125 U/L Final  . ALT 04/16/2016 10  10 - 40 U/L Final  . AST 04/16/2016 13* 14 - 40 U/L Final  . Bilirubin, Total 04/16/2016 0.9  mg/dL Final  Orders Only on 06/14/2016  Component Date Value Ref Range Status  . WBC 06/14/2016 6.7  3.4 - 10.8 x10E3/uL Final  . RBC 06/14/2016 4.31  4.14 - 5.80 x10E6/uL Final  . Hemoglobin 06/14/2016 13.0  12.6 - 17.7 g/dL Final  . Hematocrit 06/14/2016 38.8  37.5 - 51.0 % Final  . MCV 06/14/2016 90  79 - 97 fL Final  . MCH 06/14/2016 30.2  26.6 - 33.0 pg Final  . MCHC 06/14/2016 33.5  31.5 - 35.7 g/dL Final  . RDW 06/14/2016 14.7  12.3 - 15.4 % Final  . Platelets 06/14/2016 194  150 - 379 x10E3/uL Final  . Neutrophils 06/14/2016 74  Not Estab. % Final  . Lymphs 06/14/2016 16  Not Estab. % Final  . Monocytes 06/14/2016 8  Not Estab. % Final  . Eos 06/14/2016 2  Not Estab. % Final  . Basos 06/14/2016 0  Not Estab. % Final  . Neutrophils Absolute 06/14/2016 4.9  1.4 - 7.0 x10E3/uL Final  . Lymphocytes Absolute 06/14/2016 1.1  0.7 - 3.1 x10E3/uL Final  . Monocytes Absolute 06/14/2016 0.5  0.1 - 0.9 x10E3/uL Final  . EOS (ABSOLUTE) 06/14/2016 0.1  0.0 - 0.4 x10E3/uL Final  . Basophils Absolute 06/14/2016 0.0  0.0 - 0.2 x10E3/uL Final  . Immature Granulocytes 06/14/2016 0  Not Estab. % Final  . Immature Grans (Abs) 06/14/2016 0.0  0.0 - 0.1 x10E3/uL Final  . Glucose 06/14/2016 174* 65 - 99 mg/dL Final  . BUN 06/14/2016 24  8 - 27 mg/dL Final  . Creatinine, Ser 06/14/2016 1.50* 0.76 - 1.27 mg/dL Final  . GFR calc non Af Amer 06/14/2016 43* >59 mL/min/1.73 Final  . GFR calc Af Wyvonnia Lora  06/14/2016 50* >59 mL/min/1.73 Final  . BUN/Creatinine Ratio 06/14/2016 16  10 - 24 Final   . Sodium 06/14/2016 141  134 - 144 mmol/L Final  . Potassium 06/14/2016 4.8  3.5 - 5.2 mmol/L Final  . Chloride 06/14/2016 101  96 - 106 mmol/L Final  . CO2 06/14/2016 23  18 - 29 mmol/L Final  . Calcium 06/14/2016 9.7  8.6 - 10.2 mg/dL Final  . Total Protein 06/14/2016 6.9  6.0 - 8.5 g/dL Final  . Albumin 06/14/2016 4.7  3.5 - 4.7 g/dL Final  . Globulin, Total 06/14/2016 2.2  1.5 - 4.5 g/dL Final  . Albumin/Globulin Ratio 06/14/2016 2.1  1.2 - 2.2 Final  . Bilirubin Total 06/14/2016 0.7  0.0 - 1.2 mg/dL Final  . Alkaline Phosphatase 06/14/2016 72  39 - 117 IU/L Final  . AST 06/14/2016 16  0 - 40 IU/L Final  . ALT 06/14/2016 12  0 - 44 IU/L Final      Imaging: No results found.  Patient injured left eye yesterday while he was chasing a right [place] through the [facility] because this may was eating his chickens. The left eye has some redness because it got struck with a piece of steak. Patient did not notice this left eye injury. Today, he states that it is hurting a little bit. We will monitor this and he will see his eye doctor if needed.     Speciality Comments: No specialty comments available.    Procedures:  Small Joint Inj Date/Time: 11/19/2016 9:26 AM Performed by: Eliezer Lofts Authorized by: Eliezer Lofts   Consent Given by:  Patient Site marked: the procedure site was marked   Timeout: prior to procedure the correct patient, procedure, and site was verified   Indications:  Joint swelling Location:  Thumb Site:  L thumb MCP Prep: patient was prepped and draped in usual sterile fashion   Needle Size:  27 G Spinal Needle: No   Ultrasound Guided: No   Fluoroscopic Guidance: No   Medications:  0.5 mL lidocaine 1 %; 10 mg triamcinolone acetonide 40 MG/ML Aspiration Attempted: Yes   Aspirate amount (mL):  0 Patient tolerance:  Patient tolerated the procedure well with no immediate complications Comments: Left first MCP joint was injected without any  complication by Dr. Estanislado Pandy. No ultrasound was used. No fluoroscope was used. Patient tolerated procedure well.   Allergies: Sulfa antibiotics   Assessment / Plan:     Visit Diagnoses: Psoriatic arthritis ([place]) - Plan: CBC with Differential/Platelet, COMPLETE METABOLIC PANEL WITH GFR  High risk medications (not anticoagulants) long-term use - MTX-RHEUMATREX) 2.5mg  3 tablets (7.5 mg total) by mouth q Wednesday - Plan: CBC with Differential/Platelet, COMPLETE METABOLIC PANEL WITH GFR  Other psoriasis  Idiopathic chronic gout of multiple sites without tophus  Age related osteoporosis, unspecified pathological fracture presence  History of cholecystectomy - November 2017    Plan: #1: Psoriatic arthritis. Doing well. No joint pain swelling and stiffness. Patient is on methotrexate 3 pills per week. Doing well with that. Has some stiffness in his knee joint but I feel that that's all coming from osteoarthritis.  Psoriasis. No flare.  #2: High risk prescription. On methotrexate 2.5 mg 3 pills every week. Doing well. Folic acid 1 mg every day.  Note: Patient had labs done at his PCPs office, Dr. Nicki Reaper at [facility] family practice. His CBC with differential and CMP with GFR doing really well. Also has had uric acid that was 5.8.  #3: Gout.  Doing well. Uric acid at 5.8 Using allopurinol 300 mg every day and observing her proper gout diet and staying hydrated. Nose to take Colcrys/colchicine if there is a flare.  #4: Osteoarthritis of the knees. Early stiffness happening. Patient is quite active and doing well.  #5: Bilateral CMC enlarged. Since the left one is little bit larger than the right, Dr. Estanislado Pandy felt it might be because of his psoriatic arthritis and offered a cortisone injection. Patient was agreeable. After informed consent was obtained the left first MCP joint was injected with 10 mg of Kenalog with 0.5 and also 1% lidocaine. Patient tolerated procedure  well. There is no complication  Orders: Orders Placed This Encounter  Procedures  . Small Joint Injection/Arthrocentesis  . CBC with Differential/Platelet  . COMPLETE METABOLIC PANEL WITH GFR   Meds ordered this encounter  Medications  . methotrexate (RHEUMATREX) 2.5 MG tablet    Sig: Take 3 tablets (7.5 mg total) by mouth every Wednesday. Caution:Chemotherapy. Protect from light.    Dispense:  36 tablet    Refill:  0    Order Specific Question:   Supervising Provider    Answer:   Bo Merino [[id number]]  . allopurinol (ZYLOPRIM) 300 MG tablet    Sig: Take 1 tablet (300 mg total) by mouth daily.    Dispense:  90 tablet    Refill:  1    Order Specific Question:   Supervising Provider    Answer:   Bo Merino [[id number]]  . folic acid (FOLVITE) 1 MG tablet    Sig: Take 1 tablet (1 mg total) by mouth daily.    Dispense:  90 tablet    Refill:  4    Order Specific Question:   Supervising Provider    Answer:   Bo Merino [phone]    Face-to-face time spent with patient was 30 minutes. 50% of time was spent in counseling and coordination of care.  Follow-Up Instructions: Return in about 5 months (around 04/21/2017) for PsA,Ps,MTX 3pills/wk, folic 1mg  qd, oa kj, oa hands.   Eliezer Lofts, PA-C  On my exam today patient has synovial thickening in his right first MCP joint and synovitis in his left second MCP joint. Different treatment options were discussed and after informed consent was obtained left second MCP was injected as described above. He tolerated the procedure well. We will continue current treatment for now. He's been also advised to see ophthalmology for left eye injury. I examined and evaluated the patient with Eliezer Lofts PA. The plan of care was discussed as noted above.  Bo Merino, MD Note - This record has been created using Editor, commissioning.  Chart creation errors have been sought, but may not always  have been located. Such creation errors do not  reflect on  the standard of medical care.

## 2016-11-18 ENCOUNTER — Ambulatory Visit: Payer: Commercial Managed Care - HMO | Admitting: Rheumatology

## 2016-11-19 ENCOUNTER — Encounter: Payer: Self-pay | Admitting: Rheumatology

## 2016-11-19 ENCOUNTER — Telehealth: Payer: Self-pay | Admitting: Radiology

## 2016-11-19 ENCOUNTER — Ambulatory Visit (INDEPENDENT_AMBULATORY_CARE_PROVIDER_SITE_OTHER): Payer: Commercial Managed Care - HMO | Admitting: Rheumatology

## 2016-11-19 VITALS — BP 142/80 | HR 78 | Resp 16 | Wt 188.0 lb

## 2016-11-19 DIAGNOSIS — Z9049 Acquired absence of other specified parts of digestive tract: Secondary | ICD-10-CM | POA: Diagnosis not present

## 2016-11-19 DIAGNOSIS — L4050 Arthropathic psoriasis, unspecified: Secondary | ICD-10-CM

## 2016-11-19 DIAGNOSIS — M810 Age-related osteoporosis without current pathological fracture: Secondary | ICD-10-CM | POA: Diagnosis not present

## 2016-11-19 DIAGNOSIS — L408 Other psoriasis: Secondary | ICD-10-CM

## 2016-11-19 DIAGNOSIS — Z79899 Other long term (current) drug therapy: Secondary | ICD-10-CM

## 2016-11-19 DIAGNOSIS — M7989 Other specified soft tissue disorders: Secondary | ICD-10-CM

## 2016-11-19 DIAGNOSIS — M1A09X0 Idiopathic chronic gout, multiple sites, without tophus (tophi): Secondary | ICD-10-CM

## 2016-11-19 LAB — CBC WITH DIFFERENTIAL/PLATELET
BASOS ABS: 0 {cells}/uL (ref 0–200)
BASOS PCT: 0 %
EOS ABS: 170 {cells}/uL (ref 15–500)
Eosinophils Relative: 2 %
HCT: 42.3 % (ref 38.5–50.0)
Hemoglobin: 13.7 g/dL (ref 13.2–17.1)
LYMPHS PCT: 21 %
Lymphs Abs: 1785 cells/uL (ref 850–3900)
MCH: 29 pg (ref 27.0–33.0)
MCHC: 32.4 g/dL (ref 32.0–36.0)
MCV: 89.6 fL (ref 80.0–100.0)
MONO ABS: 510 {cells}/uL (ref 200–950)
MPV: 10.9 fL (ref 7.5–12.5)
Monocytes Relative: 6 %
Neutro Abs: 6035 cells/uL (ref 1500–7800)
Neutrophils Relative %: 71 %
Platelets: 195 10*3/uL (ref 140–400)
RBC: 4.72 MIL/uL (ref 4.20–5.80)
RDW: 15.7 % — AB (ref 11.0–15.0)
WBC: 8.5 10*3/uL (ref 3.8–10.8)

## 2016-11-19 LAB — COMPLETE METABOLIC PANEL WITH GFR
ALT: 13 U/L (ref 9–46)
AST: 20 U/L (ref 10–35)
Albumin: 4.2 g/dL (ref 3.6–5.1)
Alkaline Phosphatase: 67 U/L (ref 40–115)
BILIRUBIN TOTAL: 0.8 mg/dL (ref 0.2–1.2)
BUN: 22 mg/dL (ref 7–25)
CALCIUM: 9.3 mg/dL (ref 8.6–10.3)
CHLORIDE: 103 mmol/L (ref 98–110)
CO2: 23 mmol/L (ref 20–31)
CREATININE: 1.34 mg/dL — AB (ref 0.70–1.11)
GFR, EST AFRICAN AMERICAN: 57 mL/min — AB (ref >=60)
GFR, EST NON AFRICAN AMERICAN: 49 mL/min — AB (ref >=60)
Glucose, Bld: 144 mg/dL — ABNORMAL HIGH (ref 65–99)
Potassium: 4.8 mmol/L (ref 3.5–5.3)
Sodium: 138 mmol/L (ref 135–146)
Total Protein: 6.7 g/dL (ref 6.1–8.1)

## 2016-11-19 MED ORDER — ALLOPURINOL 300 MG PO TABS: 300.0000 mg | ORAL_TABLET | Freq: Every day | ORAL | 1 refills | Status: DC

## 2016-11-19 MED ORDER — LIDOCAINE HCL 1 % IJ SOLN
0.5000 mL | INTRAMUSCULAR | Status: AC | PRN
  Administered 2016-11-19: .5 mL

## 2016-11-19 MED ORDER — FOLIC ACID 1 MG PO TABS: 1.0000 mg | ORAL_TABLET | Freq: Every day | ORAL | 4 refills | Status: DC

## 2016-11-19 MED ORDER — METHOTREXATE 2.5 MG PO TABS: 7.5000 mg | ORAL_TABLET | ORAL | 0 refills | Status: DC

## 2016-11-19 MED ORDER — TRIAMCINOLONE ACETONIDE 40 MG/ML IJ SUSP
10.0000 mg | INTRAMUSCULAR | Status: AC | PRN
  Administered 2016-11-19: 10 mg via INTRA_ARTICULAR

## 2016-11-19 NOTE — Telephone Encounter (Signed)
Error

## 2016-11-22 ENCOUNTER — Telehealth: Payer: Self-pay | Admitting: *Deleted

## 2016-11-22 ENCOUNTER — Other Ambulatory Visit: Payer: Self-pay | Admitting: *Deleted

## 2016-11-22 NOTE — Telephone Encounter (Signed)
-----   Message from Eliezer Lofts, [place] sent at 11/20/2016  8:03 PM EDT ----- Please ask pt how his left eye is doing. Did he see his eye doctor yet? If no, when does he plan to go?  Is his eye feeling better? Looking better. ? ..less red?

## 2016-11-22 NOTE — Telephone Encounter (Signed)
Patient returned call and spoke with Dan Holmes. Patient has been to the eye doctor and states his eye is doing better.

## 2016-11-22 NOTE — Telephone Encounter (Signed)
Attempted to contact patient and left message for patient to call the office.  

## 2017-02-24 DIAGNOSIS — E1151 Type 2 diabetes mellitus with diabetic peripheral angiopathy without gangrene: Secondary | ICD-10-CM | POA: Diagnosis not present

## 2017-02-27 DIAGNOSIS — Z9181 History of falling: Secondary | ICD-10-CM | POA: Diagnosis not present

## 2017-02-27 DIAGNOSIS — E1129 Type 2 diabetes mellitus with other diabetic kidney complication: Secondary | ICD-10-CM | POA: Diagnosis not present

## 2017-02-27 DIAGNOSIS — Z0000 Encounter for general adult medical examination without abnormal findings: Secondary | ICD-10-CM | POA: Diagnosis not present

## 2017-02-27 DIAGNOSIS — E1165 Type 2 diabetes mellitus with hyperglycemia: Secondary | ICD-10-CM | POA: Diagnosis not present

## 2017-02-27 DIAGNOSIS — Z6827 Body mass index (BMI) 27.0-27.9, adult: Secondary | ICD-10-CM | POA: Diagnosis not present

## 2017-02-27 DIAGNOSIS — N183 Chronic kidney disease, stage 3 (moderate): Secondary | ICD-10-CM | POA: Diagnosis not present

## 2017-02-27 DIAGNOSIS — R809 Proteinuria, unspecified: Secondary | ICD-10-CM | POA: Diagnosis not present

## 2017-04-10 ENCOUNTER — Ambulatory Visit (INDEPENDENT_AMBULATORY_CARE_PROVIDER_SITE_OTHER): Payer: Commercial Managed Care - HMO | Admitting: Rheumatology

## 2017-04-10 ENCOUNTER — Encounter: Payer: Self-pay | Admitting: Rheumatology

## 2017-04-10 VITALS — BP 163/73 | HR 74 | Resp 14 | Ht 69.5 in | Wt 192.0 lb

## 2017-04-10 DIAGNOSIS — M545 Low back pain, unspecified: Secondary | ICD-10-CM

## 2017-04-10 DIAGNOSIS — M1A9XX0 Chronic gout, unspecified, without tophus (tophi): Secondary | ICD-10-CM

## 2017-04-10 DIAGNOSIS — Z79899 Other long term (current) drug therapy: Secondary | ICD-10-CM

## 2017-04-10 DIAGNOSIS — L4050 Arthropathic psoriasis, unspecified: Secondary | ICD-10-CM

## 2017-04-10 DIAGNOSIS — L408 Other psoriasis: Secondary | ICD-10-CM

## 2017-04-10 NOTE — Progress Notes (Signed)
Office Visit Note  Patient: Dan Holmes             Date of Birth: [birth year]           MRN: [id number]             PCP: Myer Peer, MD Referring: Myer Peer, MD Visit Date: 04/10/2017 Occupation: @GUAROCC @    Subjective:  No chief complaint on file.   History of Present Illness: Dan Holmes is a 82 y.o. male  : Was last seen in our office on 11/19/2016 for psoriasis and psoriatic arthritis evaluation and gout and high-risk prescription) methotrexate 2.5 mg, 3 pills once a week and folic acid 1 mg daily; also taking allopurinol 300 mg for his gout). At last visit, patient had redness to his sclera of the left eye. Mechanism of injury ===> patient running through the woods chasing snake and scraped his eye. He was not having any pain at that time. I advised him to see an eye doctor. He called back on 11/22/2016 and reported that he did go to the eye doctor and his eye was doing better. On that visit, he also reminded [place] that he had a laparoscopic cholecystectomy on November 2017 and he was doing well with that.  Today, he returns for recheck of his psoriasis, psoriatic arthritis, gout, methotrexate 3 pills per week, folic acid 1 mg daily, OA of the knee joint, OA of the hands, left eye redness. Patient reports that he is doing very well with all of these issues. He's been compliant with his medication. The only complaint he has that he is sore in his lumbar spine after mowing 5 acres of his land the other day. He did it on a single day. He states that it hurts at the same place where he had surgery where they put screws in in the lower lumbar area. He states that it gets better but then when he mows again it hurts again.    Activities of Daily Living:  Patient reports morning stiffness for 10 minutes.   Patient Reports nocturnal pain.  Difficulty dressing/grooming: Denies Difficulty climbing stairs: Denies Difficulty getting out of chair: Denies Difficulty  using hands for taps, buttons, cutlery, and/or writing: Denies   Review of Systems  Constitutional: Negative for fatigue.  HENT: Negative for mouth sores and mouth dryness.   Eyes: Negative for dryness.  Respiratory: Negative for shortness of breath.   Gastrointestinal: Negative for constipation and diarrhea.  Musculoskeletal: Negative for myalgias and myalgias.  Skin: Negative for sensitivity to sunlight.  Neurological: Negative for memory loss.  Psychiatric/Behavioral: Negative for sleep disturbance.    PMFS History:  Patient Active Problem List   Diagnosis Date Noted  . Cholecystitis 11/13/2016  . Psoriasis 06/19/2016  . DDD (degenerative disc disease), lumbar 06/19/2016  . DDD (degenerative disc disease), cervical 06/19/2016  . Osteoporosis 06/19/2016  . Kidney stones 06/19/2016  . 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 High risk medications (not anticoagulants) long-term use 06/19/2016  . Chronic gout without tophus 06/19/2016  . Abnormal LFTs   . Pancreatitis, acute   . Pancreatitis 01/27/2016  . Abdominal pain 01/27/2016  . Cough due to ACE inhibitor 12/16/2014  . Dyslipidemia 05/28/2013  . Ejection fraction   . Psoriatic arthritis ([place])   . CAD (coronary artery disease)   . Renal insufficiency   . Diabetes mellitus   . HTN (hypertension), benign   . Hx of CABG   . Carotid artery disease ([place])   .  Pain 02/16/2010    Past Medical History:  Diagnosis Date  . Arthritis   . ASCVD (arteriosclerotic cardiovascular disease)   . CAD (coronary artery disease)    January, 2012, nuclear, EF 61%, normal  . Carotid artery disease ([place])    Doppler, August, 2011, 87-68% RIC A., 1-15% LICA, followup one year  . Carpal tunnel syndrome   . DDD (degenerative disc disease), cervical 06/19/2016  . DDD (degenerative disc disease), lumbar 06/19/2016   Spinal Fusion  . Diabetes mellitus   . Ejection fraction   . Gout   . HTN (hypertension), benign   . Hx of CABG    2005  .  Hyperlipidemia   . Kidney stones 06/19/2016  . Osteoporosis 06/19/2016  . Pain 02/2010   right neck, right shoulder, right wrist, right leg-not cardiac  . 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 Psoriasis 06/19/2016  . Psoriatic arthritis ([place])    2012  . Renal insufficiency     Family History  Problem Relation Age of Onset  . CVA Mother   . Heart attack Father   . Heart disease Brother    Past Surgical History:  Procedure Laterality Date  . CORONARY ARTERY BYPASS GRAFT  2005  . CORONARY ARTERY BYPASS GRAFT    . SPINAL FUSION     Social History   Social History Narrative   Level of education: High school    Employment: retired, [employer] maintenance            Objective: Vital Signs: BP (!) 163/73   Pulse 74   Resp 14   Ht 5' 9.5" (1.765 m)   Wt 192 lb (87.1 kg)   BMI 27.95 kg/m    Physical Exam  Constitutional: He is oriented to person, place, and time. He appears well-developed and well-nourished.  HENT:  Head: Normocephalic and atraumatic.  Eyes: Pupils are equal, round, and reactive to light. Conjunctivae and EOM are normal.  Neck: Normal range of motion. Neck supple.  Cardiovascular: Normal rate, regular rhythm and normal heart sounds.  Exam reveals no gallop and no friction rub.   No murmur heard. Pulmonary/Chest: Effort normal and breath sounds normal. No respiratory distress. He has no wheezes. He has no rales. He exhibits no tenderness.  Abdominal: Soft. He exhibits no distension and no mass. There is no tenderness. There is no guarding.  Musculoskeletal: Normal range of motion.  Lymphadenopathy:    He has no cervical adenopathy.  Neurological: He is alert and oriented to person, place, and time. He exhibits normal muscle tone. Coordination normal.  Skin: Skin is warm and dry. Capillary refill takes less than 2 seconds. No rash noted.  Psychiatric: He has a normal mood and affect. His behavior is normal. Judgment and thought content normal.  Vitals reviewed.      Musculoskeletal Exam:  Full range of motion of all joints Grip strength is equal and strong bilaterally Fibromyalgia tender points are all absent  CDAI Exam: No CDAI exam completed.    Investigation: No additional findings.   Patient brings labs from his PCP Dema Severin [facility]). We will scan this into the computer Labs were drawn 02/24/2017 CMP with GFR is within normal limits except for nonfasting glucose elevated at 182; creatinine mildly elevated at 1.30 and GFR slightly low at 56 (we will monitor)   Office Visit on 11/19/2016  Component Date Value Ref Range Status  . WBC 11/19/2016 8.5  3.8 - 10.8 K/uL Final  . RBC 11/19/2016 4.72  4.20 - 5.80 MIL/uL Final  . Hemoglobin 11/19/2016 13.7  13.2 - 17.1 g/dL Final  . HCT 11/19/2016 42.3  38.5 - 50.0 % Final  . MCV 11/19/2016 89.6  80.0 - 100.0 fL Final  . MCH 11/19/2016 29.0  27.0 - 33.0 pg Final  . MCHC 11/19/2016 32.4  32.0 - 36.0 g/dL Final  . RDW 11/19/2016 15.7* 11.0 - 15.0 % Final  . Platelets 11/19/2016 195  140 - 400 K/uL Final  . MPV 11/19/2016 10.9  7.5 - 12.5 fL Final  . Neutro Abs 11/19/2016 6035  1,500 - 7,800 cells/uL Final  . Lymphs Abs 11/19/2016 1785  850 - 3,900 cells/uL Final  . Monocytes Absolute 11/19/2016 510  200 - 950 cells/uL Final  . Eosinophils Absolute 11/19/2016 170  15 - 500 cells/uL Final  . Basophils Absolute 11/19/2016 0  0 - 200 cells/uL Final  . Neutrophils Relative % 11/19/2016 71  % Final  . Lymphocytes Relative 11/19/2016 21  % Final  . Monocytes Relative 11/19/2016 6  % Final  . Eosinophils Relative 11/19/2016 2  % Final  . Basophils Relative 11/19/2016 0  % Final  . Smear Review 11/19/2016 Criteria for review not met   Final  . Sodium 11/19/2016 138  135 - 146 mmol/L Final  . Potassium 11/19/2016 4.8  3.5 - 5.3 mmol/L Final  . Chloride 11/19/2016 103  98 - 110 mmol/L Final  . CO2 11/19/2016 23  20 - 31 mmol/L Final  . Glucose, Bld 11/19/2016 144* 65 - 99 mg/dL Final  .  BUN 11/19/2016 22  7 - 25 mg/dL Final  . Creat 11/19/2016 1.34* 0.70 - 1.11 mg/dL Final   Comment:   For patients > or = 81 years of age: The upper reference limit for Creatinine is approximately 13% higher for people identified as African-American.     . Total Bilirubin 11/19/2016 0.8  0.2 - 1.2 mg/dL Final  . Alkaline Phosphatase 11/19/2016 67  40 - 115 U/L Final  . AST 11/19/2016 20  10 - 35 U/L Final  . ALT 11/19/2016 13  9 - 46 U/L Final  . Total Protein 11/19/2016 6.7  6.1 - 8.1 g/dL Final  . Albumin 11/19/2016 4.2  3.6 - 5.1 g/dL Final  . Calcium 11/19/2016 9.3  8.6 - 10.3 mg/dL Final  . GFR, Est African American 11/19/2016 57* >=60 mL/min Final  . GFR, Est Non African American 11/19/2016 49* >=60 mL/min Final     Imaging: No results found.       Speciality Comments: No specialty comments available.    Procedures:  No procedures performed Allergies: Sulfa antibiotics   Assessment / Plan:     Visit Diagnoses: Psoriatic arthritis ([place])  Other psoriasis  High risk medications (not anticoagulants) long-term use  Chronic gout without tophus, unspecified cause, unspecified site  Age related osteoporosis, unspecified pathological fracture presence   Plan: #1:Psoriasis and psoriatic arthritis. No flare. We'll control. No synovitis on exam Pain to hands consistent with osteoarthritis (minor/mild)  #2: High risk prescription Methotrexate 3 pills per week Folic acid 1 mg daily Labs done at PCPs office in July 2018 are within normal limits. CMP was done, see [facility] with differential was not done and uric acid was not done. I gave him printed [facility] lab order for the next labs that he will be getting in late September or early October. I asked him that if his PCP will be getting blood work and he'll be sharing it  with [place], we need to get CBC with differential, CMP with GFR, uric acid. Otherwise if things are missing, we will have to draw blood at our  office. Patient understands and is agreeable  #3: Gout. No flare. Taking allopurinol 300 mg daily  #4: Left eye sclera that was read at the last visit has resolved fully. Vision is normal. No pain.  #5: Low back pain. Patient had surgery at that site. He mowed 5 acres of lawn the other day and mildly exacerbated his low back pain. It is improving. He reports that it normally does this when he overdoes it with his back.  #6: Return to clinic in 5 months  Orders: No orders of the defined types were placed in this encounter.  No orders of the defined types were placed in this encounter.   Face-to-face time spent with patient was 30 minutes. 50% of time was spent in counseling and coordination of care.  Follow-Up Instructions: No Follow-up on file.   Eliezer Lofts, PA-C  Note - This record has been created using [employer].  Chart creation errors have been sought, but may not always  have been located. Such creation errors do not reflect on  the standard of medical care.

## 2017-04-24 ENCOUNTER — Ambulatory Visit: Payer: Commercial Managed Care - HMO | Admitting: Rheumatology

## 2017-05-26 DIAGNOSIS — E1129 Type 2 diabetes mellitus with other diabetic kidney complication: Secondary | ICD-10-CM | POA: Diagnosis not present

## 2017-05-26 DIAGNOSIS — E1151 Type 2 diabetes mellitus with diabetic peripheral angiopathy without gangrene: Secondary | ICD-10-CM | POA: Diagnosis not present

## 2017-05-30 DIAGNOSIS — M109 Gout, unspecified: Secondary | ICD-10-CM | POA: Diagnosis not present

## 2017-05-30 DIAGNOSIS — Z1339 Encounter for screening examination for other mental health and behavioral disorders: Secondary | ICD-10-CM | POA: Diagnosis not present

## 2017-05-30 DIAGNOSIS — E1149 Type 2 diabetes mellitus with other diabetic neurological complication: Secondary | ICD-10-CM | POA: Diagnosis not present

## 2017-05-30 DIAGNOSIS — Z23 Encounter for immunization: Secondary | ICD-10-CM | POA: Diagnosis not present

## 2017-05-30 DIAGNOSIS — E1129 Type 2 diabetes mellitus with other diabetic kidney complication: Secondary | ICD-10-CM | POA: Diagnosis not present

## 2017-05-30 DIAGNOSIS — Z1331 Encounter for screening for depression: Secondary | ICD-10-CM | POA: Diagnosis not present

## 2017-05-30 DIAGNOSIS — E1165 Type 2 diabetes mellitus with hyperglycemia: Secondary | ICD-10-CM | POA: Diagnosis not present

## 2017-06-13 ENCOUNTER — Other Ambulatory Visit: Payer: Self-pay | Admitting: Rheumatology

## 2017-06-16 NOTE — Telephone Encounter (Signed)
Last Visit: 04/10/17 Next Visit: 09/18/17 Labs: 05/26/17 Stable  Okay to refill per Dr. Estanislado Pandy

## 2017-07-25 ENCOUNTER — Other Ambulatory Visit: Payer: Self-pay | Admitting: Rheumatology

## 2017-07-25 NOTE — Telephone Encounter (Signed)
Last Visit: 04/10/17 Next Visit: 09/18/17 Labs: 05/26/17 Stable  Okay to refill per Dr. Estanislado Pandy

## 2017-08-27 DIAGNOSIS — J22 Unspecified acute lower respiratory infection: Secondary | ICD-10-CM | POA: Diagnosis not present

## 2017-08-27 DIAGNOSIS — Z6826 Body mass index (BMI) 26.0-26.9, adult: Secondary | ICD-10-CM | POA: Diagnosis not present

## 2017-09-04 NOTE — Progress Notes (Deleted)
Office Visit Note  Patient: Dan Holmes             Date of Birth: 1931/12/20           MRN: [id number]             PCP: Myer Peer, MD Referring: Myer Peer, MD Visit Date: 09/18/2017 Occupation: @GUAROCC @    Subjective:  No chief complaint on file.   History of Present Illness: Dan Holmes is a 84 y.o. male ***   Activities of Daily Living:  Patient reports morning stiffness for *** {minute/hour:19697}.   Patient {ACTIONS;DENIES/REPORTS:21021675::"Denies"} nocturnal pain.  Difficulty dressing/grooming: {ACTIONS;DENIES/REPORTS:21021675::"Denies"} Difficulty climbing stairs: {ACTIONS;DENIES/REPORTS:21021675::"Denies"} Difficulty getting out of chair: {ACTIONS;DENIES/REPORTS:21021675::"Denies"} Difficulty using hands for taps, buttons, cutlery, and/or writing: {ACTIONS;DENIES/REPORTS:21021675::"Denies"}   No Rheumatology ROS completed.   PMFS History:  Patient Active Problem List   Diagnosis Date Noted  . Cholecystitis 11/13/2016  . Psoriasis 06/19/2016  . DDD (degenerative disc disease), lumbar 06/19/2016  . DDD (degenerative disc disease), cervical 06/19/2016  . Osteoporosis 06/19/2016  . Kidney stones 06/19/2016  . 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 High risk medications (not anticoagulants) long-term use 06/19/2016  . Chronic gout without tophus 06/19/2016  . Abnormal LFTs   . Pancreatitis, acute   . Pancreatitis 01/27/2016  . Abdominal pain 01/27/2016  . Cough due to ACE inhibitor 12/16/2014  . Dyslipidemia 05/28/2013  . Ejection fraction   . Psoriatic arthritis ([place])   . CAD (coronary artery disease)   . Renal insufficiency   . Diabetes mellitus   . HTN (hypertension), benign   . Hx of CABG   . Carotid artery disease ([place])   . Pain 02/16/2010    Past Medical History:  Diagnosis Date  . Arthritis   . ASCVD (arteriosclerotic cardiovascular disease)   . CAD (coronary artery disease)    January, 2012, nuclear, EF 61%,  normal  . Carotid artery disease ([place])    Doppler, August, 2011, 55-73% RIC A., 2-20% LICA, followup one year  . Carpal tunnel syndrome   . DDD (degenerative disc disease), cervical 06/19/2016  . DDD (degenerative disc disease), lumbar 06/19/2016   Spinal Fusion  . Diabetes mellitus   . Ejection fraction   . Gout   . HTN (hypertension), benign   . Hx of CABG    2005  . Hyperlipidemia   . Kidney stones 06/19/2016  . Osteoporosis 06/19/2016  . Pain 02/2010   right neck, right shoulder, right wrist, right leg-not cardiac  . 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 Psoriasis 06/19/2016  . Psoriatic arthritis ([place])    2012  . Renal insufficiency     Family History  Problem Relation Age of Onset  . CVA Mother   . Heart attack Father   . Heart disease Brother    Past Surgical History:  Procedure Laterality Date  . CORONARY ARTERY BYPASS GRAFT  2005  . CORONARY ARTERY BYPASS GRAFT    . SPINAL FUSION     Social History   Social History Narrative   Level of education: High school    Employment: retired, [employer] maintenance         Objective: Vital Signs: There were no vitals taken for this visit.   Physical Exam   Musculoskeletal Exam: ***  CDAI Exam: No CDAI exam completed.    Investigation: No additional findings. CBC Latest Ref Rng & Units 11/19/2016 06/14/2016 04/16/2016  WBC 3.8 - 10.8 K/uL 8.5 6.7 6.8  Hemoglobin 13.2 - 17.1 g/dL  13.7 13.0 13.2(A)  Hematocrit 38.5 - 50.0 % 42.3 38.8 40(A)  Platelets 140 - 400 K/uL 195 194 201   CMP Latest Ref Rng & Units 11/19/2016 06/14/2016 04/16/2016  Glucose 65 - 99 mg/dL 144(H) 174(H) -  BUN 7 - 25 mg/dL 22 24 13   Creatinine 0.70 - 1.11 mg/dL 1.34(H) 1.50(H) 1.3  Sodium 135 - 146 mmol/L 138 141 138  Potassium 3.5 - 5.3 mmol/L 4.8 4.8 5.0  Chloride 98 - 110 mmol/L 103 101 -  CO2 20 - 31 mmol/L 23 23 -  Calcium 8.6 - 10.3 mg/dL 9.3 9.7 -  Total Protein 6.1 - 8.1 g/dL 6.7 6.9 -  Total Bilirubin 0.2 - 1.2 mg/dL 0.8 0.7 -  Alkaline  Phos 40 - 115 U/L 67 72 66  AST 10 - 35 U/L 20 16 13(A)  ALT 9 - 46 U/L 13 12 10     Imaging: No results found.  Speciality Comments: No specialty comments available.    Procedures:  No procedures performed Allergies: Sulfa antibiotics   Assessment / Plan:     Visit Diagnoses: No diagnosis found.    Orders: No orders of the defined types were placed in this encounter.  No orders of the defined types were placed in this encounter.   Face-to-face time spent with patient was *** minutes. 50% of time was spent in counseling and coordination of care.  Follow-Up Instructions: No Follow-up on file.   Earnestine Mealing, CMA  Note - This record has been created using [employer].  Chart creation errors have been sought, but may not always  have been located. Such creation errors do not reflect on  the standard of medical care.

## 2017-09-11 IMAGING — DX DG CHEST 2V
2 series · 2 of 2 positions shown · non-contrast
Comparison: 11/05/2010

CLINICAL DATA: Chest pain for 1 day

EXAM:
CHEST  2 VIEW

[chest pa]
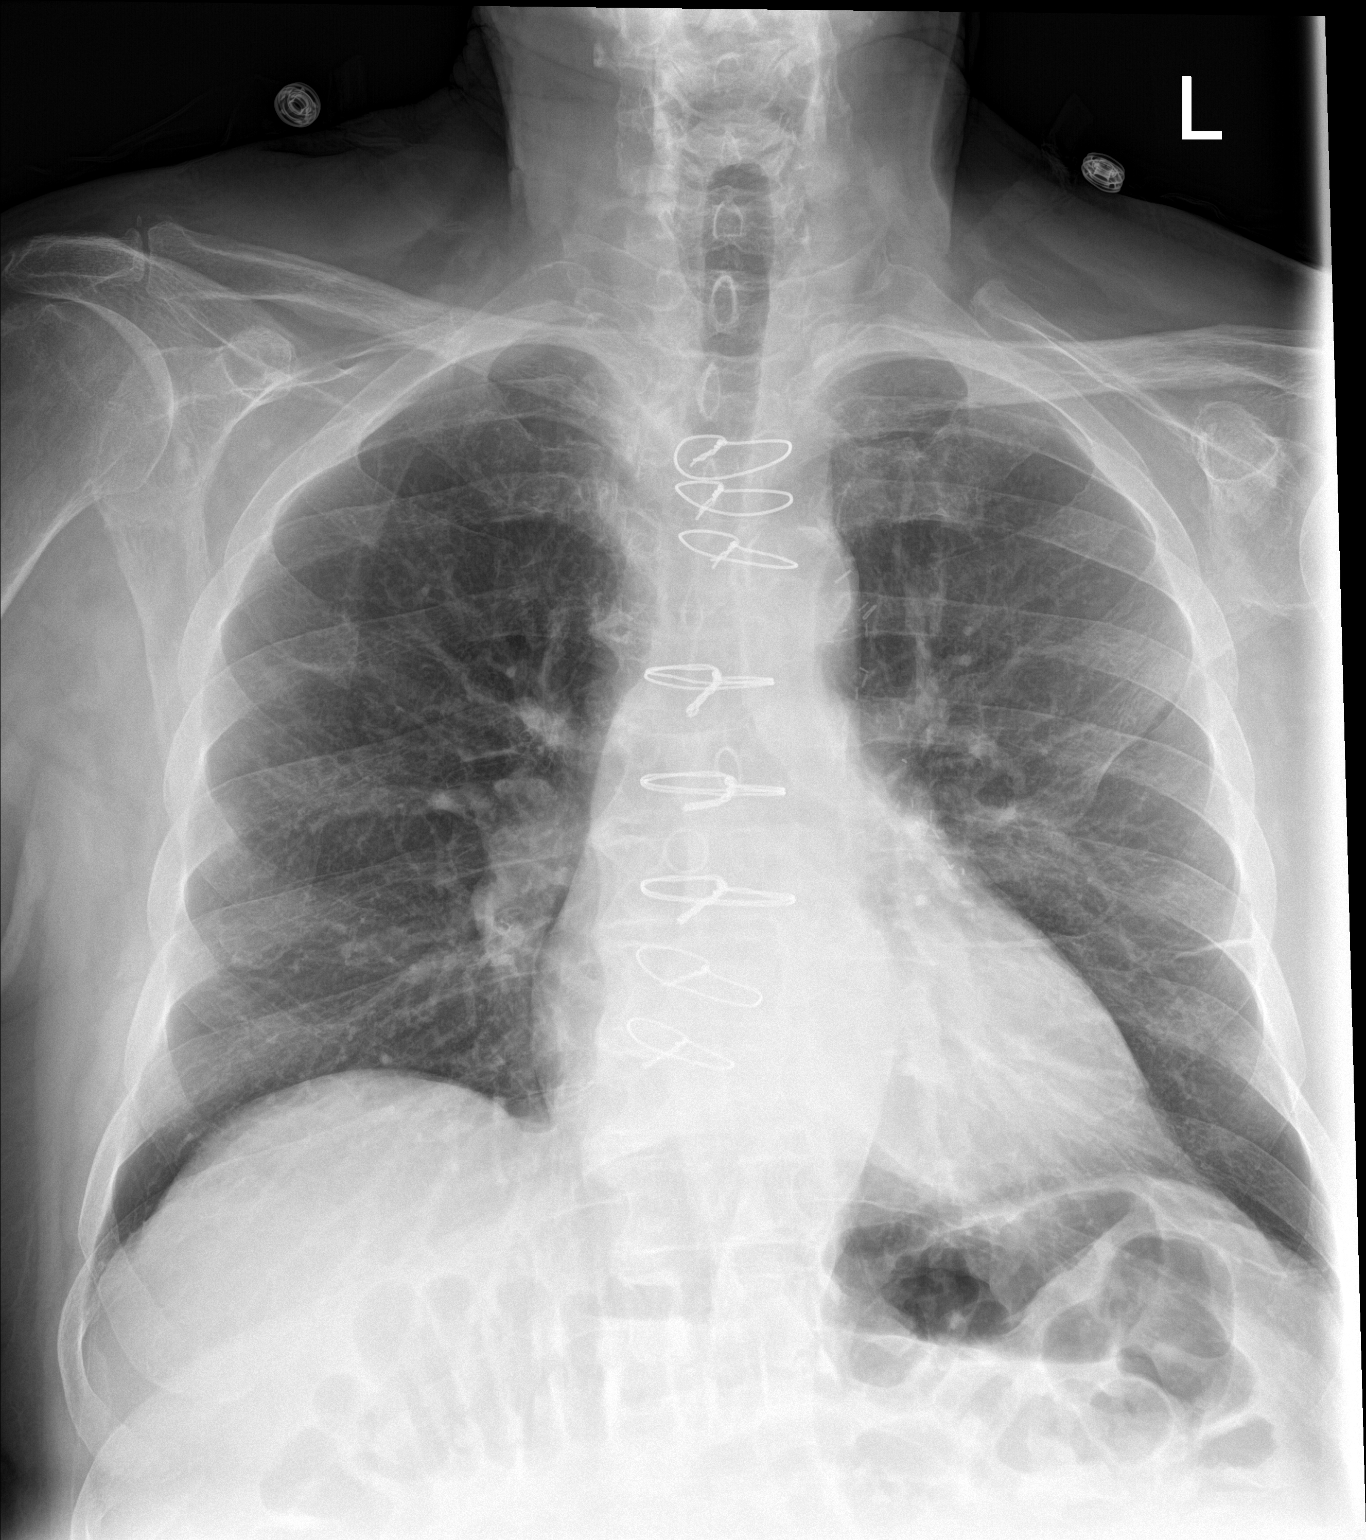

[chest lat]
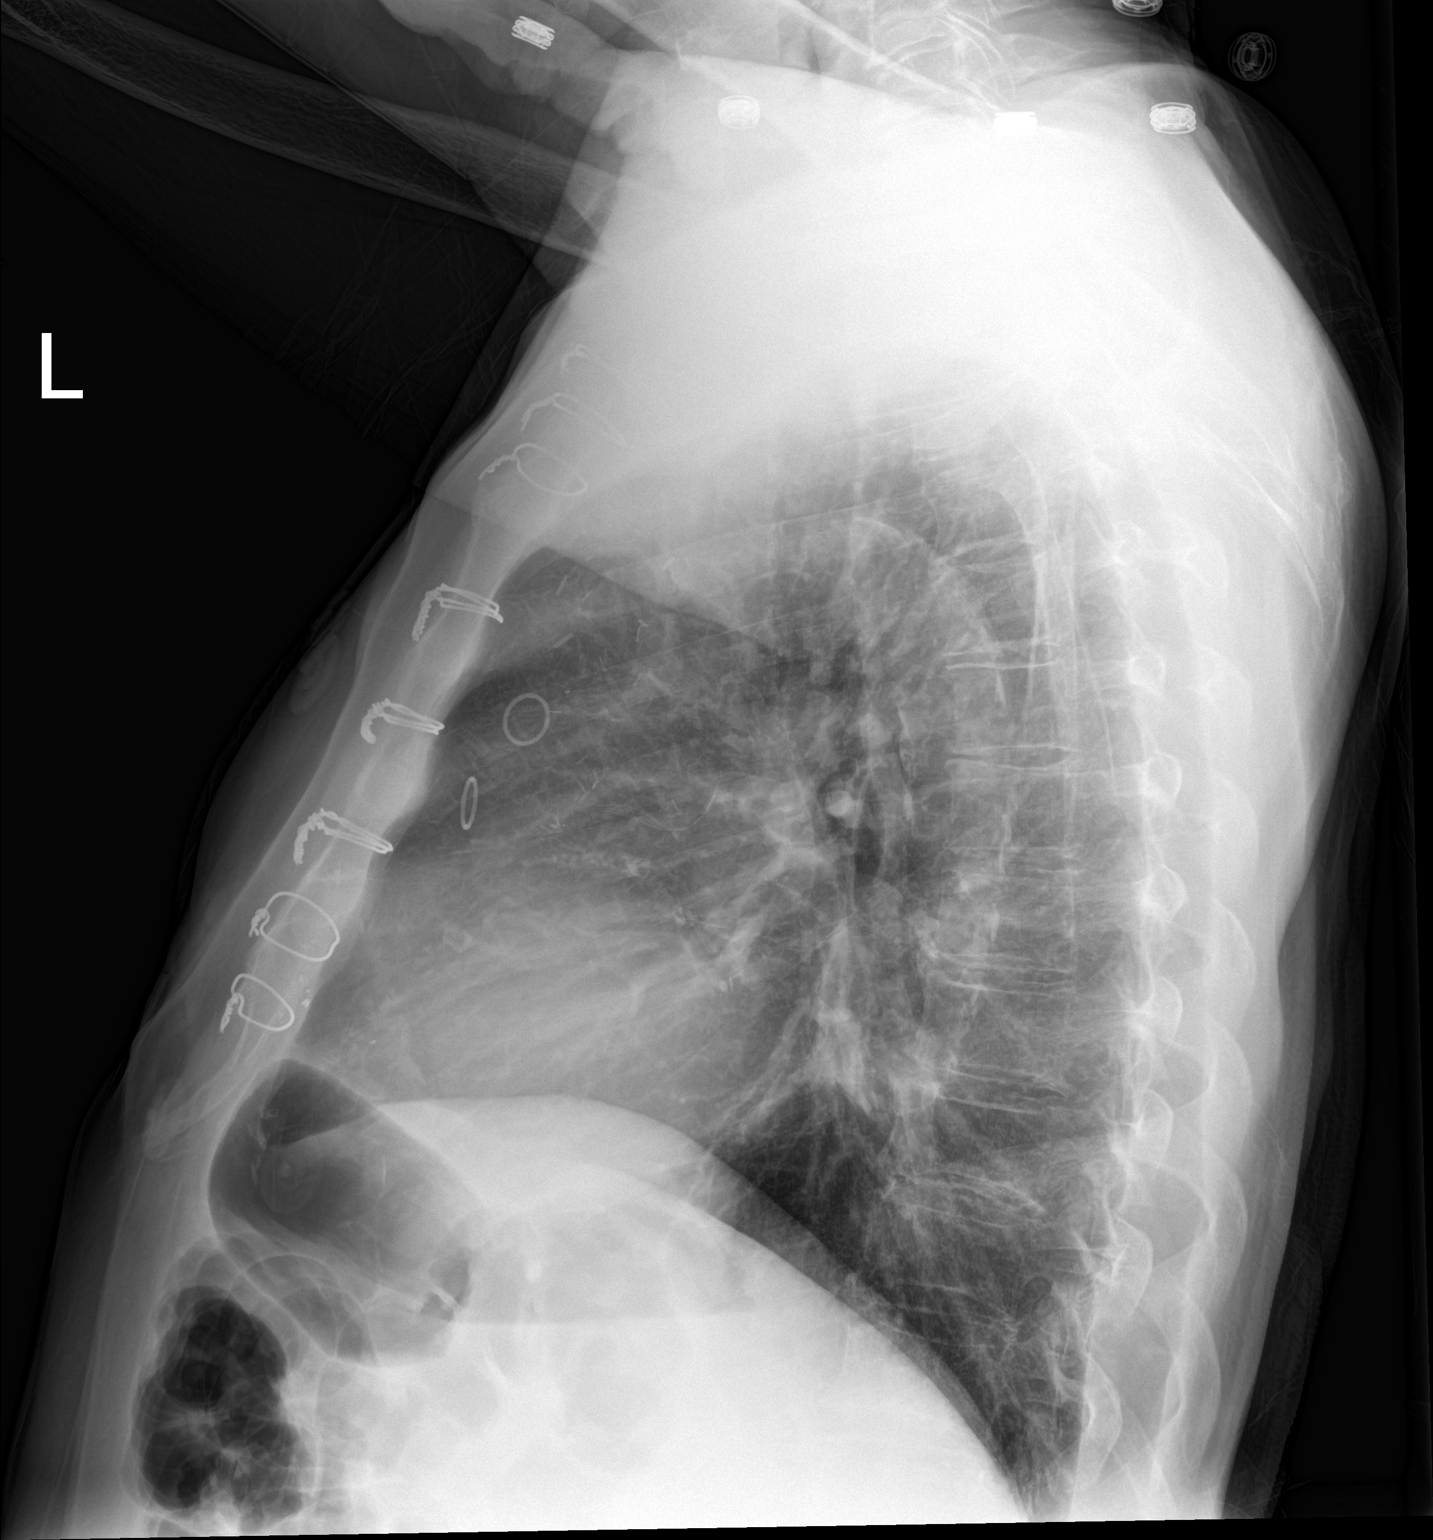

[2 of 2 positions shown; findings below may reference images not displayed]

FINDINGS: Cardiomediastinal silhouette is stable. Status post CABG. No acute
infiltrate or pleural effusion. No pulmonary edema. Bony thorax is
unremarkable.
IMPRESSION: No active cardiopulmonary disease.

## 2017-09-18 ENCOUNTER — Ambulatory Visit: Payer: Commercial Managed Care - HMO | Admitting: Rheumatology

## 2017-09-19 DIAGNOSIS — M109 Gout, unspecified: Secondary | ICD-10-CM | POA: Diagnosis not present

## 2017-09-19 DIAGNOSIS — Z79899 Other long term (current) drug therapy: Secondary | ICD-10-CM | POA: Diagnosis not present

## 2017-09-19 DIAGNOSIS — E1151 Type 2 diabetes mellitus with diabetic peripheral angiopathy without gangrene: Secondary | ICD-10-CM | POA: Diagnosis not present

## 2017-09-24 NOTE — Progress Notes (Addendum)
Office Visit Note  Patient: Dan Holmes             Date of Birth: Feb 01, 1932           MRN: [id number]             PCP: Myer Peer, MD Referring: Myer Peer, MD Visit Date: 10/06/2017 Occupation: @GUAROCC @    Subjective:  Medication monitoring   History of Present Illness: Dan Holmes is a 84 y.o. male with history of psoriatic arthritis, psoriasis, gout, DDD.  Patient states that he continues to take MTX 3 tablets weekly, folic acid 1 mg daily, and allopurinol 300 mg daily.  He denies any recent gout flares.  He states his knees cause discomfort occasionally but he has no joint pain or joint swelling.  He states he has not had any psoriasis lately.  He denies any joint stiffness or joint swelling.  He denies any joint pain today.  He experiences occasional discomfort in his lower back and neck.  He has limited ROM in his neck.   Activities of Daily Living:  Patient reports morning stiffness for 0 minutes.   Patient Denies nocturnal pain.  Difficulty dressing/grooming: Denies Difficulty climbing stairs: Denies Difficulty getting out of chair: Denies Difficulty using hands for taps, buttons, cutlery, and/or writing: Denies   Review of Systems  Constitutional: Negative for fatigue, night sweats and weakness.  HENT: Negative for mouth sores, mouth dryness and nose dryness.   Eyes: Negative for pain, redness, visual disturbance and dryness.  Respiratory: Positive for cough. Negative for hemoptysis, shortness of breath and difficulty breathing.   Cardiovascular: Positive for hypertension. Negative for chest pain, palpitations, irregular heartbeat and swelling in legs/feet.  Gastrointestinal: Negative for blood in stool, constipation and diarrhea.  Endocrine: Negative for increased urination.  Genitourinary: Negative for painful urination.  Musculoskeletal: Negative for arthralgias, joint pain, joint swelling, myalgias, muscle weakness, morning stiffness,  muscle tenderness and myalgias.  Skin: Negative for color change, pallor, rash, hair loss, nodules/bumps, redness, skin tightness, ulcers and sensitivity to sunlight.  Allergic/Immunologic: Negative for susceptible to infections.  Neurological: Negative for dizziness, fainting, memory loss and night sweats.  Hematological: Negative for swollen glands.  Psychiatric/Behavioral: Negative for depressed mood and sleep disturbance. The patient is not nervous/anxious.     PMFS History:  Patient Active Problem List   Diagnosis Date Noted  . Cholecystitis 11/13/2016  . Psoriasis 06/19/2016  . DDD (degenerative disc disease), lumbar 06/19/2016  . DDD (degenerative disc disease), cervical 06/19/2016  . Osteoporosis 06/19/2016  . Kidney stones 06/19/2016  . 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 High risk medications (not anticoagulants) long-term use 06/19/2016  . Chronic gout without tophus 06/19/2016  . Abnormal LFTs   . Pancreatitis, acute   . Pancreatitis 01/27/2016  . Abdominal pain 01/27/2016  . Cough due to ACE inhibitor 12/16/2014  . Dyslipidemia 05/28/2013  . Ejection fraction   . Psoriatic arthritis ([place])   . CAD (coronary artery disease)   . Renal insufficiency   . Diabetes mellitus   . HTN (hypertension), benign   . Hx of CABG   . Carotid artery disease ([place])   . Pain 02/16/2010    Past Medical History:  Diagnosis Date  . Arthritis   . ASCVD (arteriosclerotic cardiovascular disease)   . CAD (coronary artery disease)    January, 2012, nuclear, EF 61%, normal  . Carotid artery disease ([place])    Doppler, August, 2011, 93-79% RIC A., 0-24% LICA, followup one  year  . Carpal tunnel syndrome   . DDD (degenerative disc disease), cervical 06/19/2016  . DDD (degenerative disc disease), lumbar 06/19/2016   Spinal Fusion  . Diabetes mellitus   . Ejection fraction   . Gout   . HTN (hypertension), benign   . Hx of CABG    2005  . Hyperlipidemia   . Kidney stones 06/19/2016  .  Osteoporosis 06/19/2016  . Pain 02/2010   right neck, right shoulder, right wrist, right leg-not cardiac  . 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 Psoriasis 06/19/2016  . Psoriatic arthritis ([place])    2012  . Renal insufficiency     Family History  Problem Relation Age of Onset  . CVA Mother   . Stroke Mother   . Heart attack Father   . Heart disease Brother    Past Surgical History:  Procedure Laterality Date  . CHOLECYSTECTOMY    . CORONARY ARTERY BYPASS GRAFT  2005  . CORONARY ARTERY BYPASS GRAFT    . SPINAL FUSION     Social History   Social History Narrative   Level of education: High school    Employment: retired, [employer] maintenance         Objective: Vital Signs: BP (!) 148/70 (BP Location: Right Arm, Patient Position: Sitting, Cuff Size: Normal)   Pulse 77   Resp 16   Ht 5\' 10"  (1.778 m)   Wt 192 lb (87.1 kg)   BMI 27.55 kg/m    Physical Exam  Constitutional: He is oriented to person, place, and time. He appears well-developed and well-nourished.  HENT:  Head: Normocephalic and atraumatic.  Eyes: Conjunctivae and EOM are normal. Pupils are equal, round, and reactive to light.  Neck: Normal range of motion. Neck supple.  Cardiovascular: Normal rate, regular rhythm and normal heart sounds.  Pulmonary/Chest: Effort normal and breath sounds normal.  Abdominal: Soft. Bowel sounds are normal.  Neurological: He is alert and oriented to person, place, and time.  Skin: Skin is warm and dry. Capillary refill takes less than 2 seconds.  Psychiatric: He has a normal mood and affect. His behavior is normal.  Nursing note and vitals reviewed.    Musculoskeletal Exam: C-spine very limited ROM.  Limited ROM of thoracic and lumbar spine.  No midline spinal tenderness.  No SI joint tenderness.  Shoulder joints slightly limited abduction with no discomfort.  Elbow joints good ROM.  Limited wrist ROM bilaterally.  He has synovial thickening of his PIP and DIP joints bilaterally.   He has not tenderness on exam.  Complete fist formation and good ROM of MCPs, PIPs ,and DIPs  With no synovitis.  Hip joints, knee joints, ankle joints, MTPs, PIPs, and DIPs good ROM with no synovitis.  No warmth or effusion of bilateral knees.  No knee crepitus.   CDAI Exam: CDAI Homunculus Exam:   Joint Counts:  CDAI Tender Joint count: 0 CDAI Swollen Joint count: 0  Global Assessments:  Patient Global Assessment: 5 Provider Global Assessment: 5  CDAI Calculated Score: 10    Investigation: No additional findings. CBC Latest Ref Rng & Units 11/19/2016 06/14/2016 04/16/2016  WBC 3.8 - 10.8 K/uL 8.5 6.7 6.8  Hemoglobin 13.2 - 17.1 g/dL 13.7 13.0 13.2(A)  Hematocrit 38.5 - 50.0 % 42.3 38.8 40(A)  Platelets 140 - 400 K/uL 195 194 201   CMP Latest Ref Rng & Units 11/19/2016 06/14/2016 04/16/2016  Glucose 65 - 99 mg/dL 144(H) 174(H) -  BUN 7 - 25 mg/dL 22 24  13  Creatinine 0.70 - 1.11 mg/dL 1.34(H) 1.50(H) 1.3  Sodium 135 - 146 mmol/L 138 141 138  Potassium 3.5 - 5.3 mmol/L 4.8 4.8 5.0  Chloride 98 - 110 mmol/L 103 101 -  CO2 20 - 31 mmol/L 23 23 -  Calcium 8.6 - 10.3 mg/dL 9.3 9.7 -  Total Protein 6.1 - 8.1 g/dL 6.7 6.9 -  Total Bilirubin 0.2 - 1.2 mg/dL 0.8 0.7 -  Alkaline Phos 40 - 115 U/L 67 72 66  AST 10 - 35 U/L 20 16 13(A)  ALT 9 - 46 U/L 13 12 10     Imaging: No results found.  Speciality Comments: No specialty comments available.    Procedures:  No procedures performed Allergies: Sulfa antibiotics   Assessment / Plan:     Visit Diagnoses: Psoriatic arthritis ([place]): No synovitis or dactylitis on exam today.  He is clinically doing well on Methotrexate 3 tablets weekly and folic acid 1 mg daily.  He does not have any joint tenderness or joint swelling.  He experiences occasional joint stiffness.  He wad advised to call the office if he experiences increased joint pain or joint swelling.  He is clinically doing well on MTX 3 tablets weekly at this time.    Psoriasis:  No active psoriasis.  MTX has controlled his psoriasis.  He does not use any topical creams.   High risk medications (not anticoagulants) long-term use - MTX 3 tablets and folic acid 1 mg.  He has CBC and CMP drawn on 09/19/17.  His LFTs are not elevated at this time.  Creat WNL.  CBC and CMP will be due in May and every 3 months to monitor for drug toxicity.    Idiopathic chronic gout of multiple sites without tophus - Allopurinol 300 mg daily. He has no had any recent gout flares.  He avoid trigger foods and beer.  His most recent uric acid on 21/1/19 was 5.6.  He will continue on Allopurinol 300 mg daily.    DDD (degenerative disc disease), cervical: Very limited ROM on exam.  He works on his motion at home.  He experiences occasional discomfort.   DDD (degenerative disc disease), lumbar: He has limited ROM.  No discomfort at this time.  No midline spinal tenderness.   Other medical conditions are listed as follows:   Age-related osteoporosis without current pathological fracture  Parsonage-Turner syndrome  Renal insufficiency  Abnormal LFTs  Hx of CABG  Dyslipidemia  Essential hypertension    Orders: No orders of the defined types were placed in this encounter.  No orders of the defined types were placed in this encounter.   Face-to-face time spent with patient was 30 minutes. Greater than 50% of time was spent in counseling and coordination of care.  Follow-Up Instructions: Return in about 5 months (around 03/05/2018) for Psoriatic arthritis, Gout.   Ofilia Neas, PA-C  Bo Merino, MD Note - This record has been created using Dragon software.  Chart creation errors have been sought, but may not always  have been located. Such creation errors do not reflect on  the standard of medical care.

## 2017-09-25 DIAGNOSIS — E1165 Type 2 diabetes mellitus with hyperglycemia: Secondary | ICD-10-CM | POA: Diagnosis not present

## 2017-09-25 DIAGNOSIS — E1159 Type 2 diabetes mellitus with other circulatory complications: Secondary | ICD-10-CM | POA: Diagnosis not present

## 2017-09-25 DIAGNOSIS — Z6826 Body mass index (BMI) 26.0-26.9, adult: Secondary | ICD-10-CM | POA: Diagnosis not present

## 2017-09-25 DIAGNOSIS — E119 Type 2 diabetes mellitus without complications: Secondary | ICD-10-CM | POA: Diagnosis not present

## 2017-10-06 ENCOUNTER — Encounter: Payer: Self-pay | Admitting: Physician Assistant

## 2017-10-06 ENCOUNTER — Ambulatory Visit: Payer: Medicare HMO | Admitting: Physician Assistant

## 2017-10-06 VITALS — BP 148/70 | HR 77 | Resp 16 | Ht 70.0 in | Wt 192.0 lb

## 2017-10-06 DIAGNOSIS — R945 Abnormal results of liver function studies: Secondary | ICD-10-CM

## 2017-10-06 DIAGNOSIS — I10 Essential (primary) hypertension: Secondary | ICD-10-CM

## 2017-10-06 DIAGNOSIS — N289 Disorder of kidney and ureter, unspecified: Secondary | ICD-10-CM | POA: Diagnosis not present

## 2017-10-06 DIAGNOSIS — M5030 Other cervical disc degeneration, unspecified cervical region: Secondary | ICD-10-CM | POA: Diagnosis not present

## 2017-10-06 DIAGNOSIS — L409 Psoriasis, unspecified: Secondary | ICD-10-CM | POA: Diagnosis not present

## 2017-10-06 DIAGNOSIS — M810 Age-related osteoporosis without current pathological fracture: Secondary | ICD-10-CM

## 2017-10-06 DIAGNOSIS — Z951 Presence of aortocoronary bypass graft: Secondary | ICD-10-CM

## 2017-10-06 DIAGNOSIS — E785 Hyperlipidemia, unspecified: Secondary | ICD-10-CM | POA: Diagnosis not present

## 2017-10-06 DIAGNOSIS — L4050 Arthropathic psoriasis, unspecified: Secondary | ICD-10-CM

## 2017-10-06 DIAGNOSIS — M1A09X0 Idiopathic chronic gout, multiple sites, without tophus (tophi): Secondary | ICD-10-CM | POA: Diagnosis not present

## 2017-10-06 DIAGNOSIS — G545 Neuralgic amyotrophy: Secondary | ICD-10-CM | POA: Diagnosis not present

## 2017-10-06 DIAGNOSIS — Z79899 Other long term (current) drug therapy: Secondary | ICD-10-CM | POA: Diagnosis not present

## 2017-10-06 DIAGNOSIS — M5136 Other intervertebral disc degeneration, lumbar region: Secondary | ICD-10-CM | POA: Diagnosis not present

## 2017-10-06 DIAGNOSIS — R7989 Other specified abnormal findings of blood chemistry: Secondary | ICD-10-CM

## 2017-10-06 NOTE — Patient Instructions (Signed)
Standing Labs We placed an order today for your standing lab work.    Please come back and get your standing labs in May and every 3 months  We have open lab Monday through Friday from 8:30-11:30 AM and 1:30-4 PM at the office of Dr. Shaili Deveshwar.   The office is located at [address], [address], [place], [place] [address] No appointment is necessary.   Labs are drawn by [facility].  You may receive a bill from [facility] for your lab work. If you have any questions regarding directions or hours of operation,  please call [phone].    

## 2017-10-07 IMAGING — NM NM HEPATOBILIARY IMAGE, INC GB
4 series · 19 of 19 positions shown · non-contrast
Comparison: CT abdomen 09/19/2004

CLINICAL DATA: Epigastric pain with nausea and vomiting. Pain
exacerbated by fatty food

EXAM:
NUCLEAR MEDICINE HEPATOBILIARY IMAGING
TECHNIQUE: Sequential images of the abdomen were obtained [DATE] minutes
following intravenous administration of radiopharmaceutical.
RADIOPHARMACEUTICALS:  5.1 mCi Qc-HHm  Choletec IV

[he hepatobiliary · 3.43mm/px · 6 of 30 frames shown (1 of 4)]
[frame 3/30]
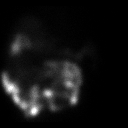
[frame 8/30]
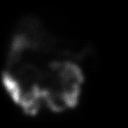
[frame 13/30]
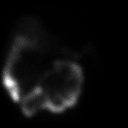
[frame 18/30]
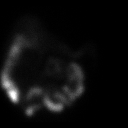
[frame 23/30]
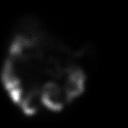
[frame 28/30]
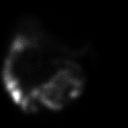

[he hepatobiliary · 2.51mm/px · 1 of 1 slices shown (2 of 4)]
[im 1/1]
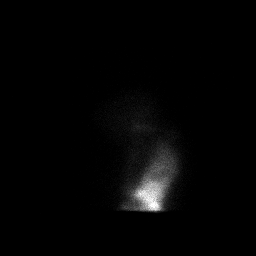

[he hepatobiliary · 3.43mm/px · 6 of 31 frames shown (3 of 4)]
[frame 3/31]
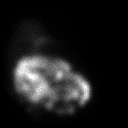
[frame 8/31]
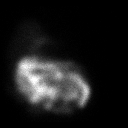
[frame 13/31]
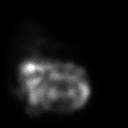
[frame 18/31]
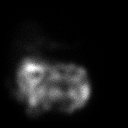
[frame 23/31]
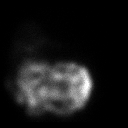
[frame 29/31]
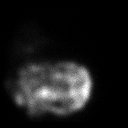

[he hepatobiliary · 3.43mm/px · 6 of 60 frames shown (4 of 4)]
[frame 6/60]
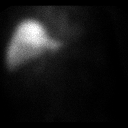
[frame 16/60]
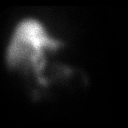
[frame 26/60]
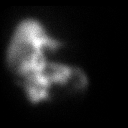
[frame 36/60]
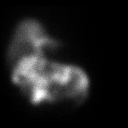
[frame 46/60]
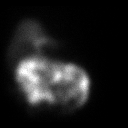
[frame 56/60]
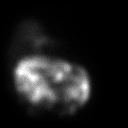

[19 of 19 positions shown; findings below may reference images not displayed]

FINDINGS: Uniform uptake of radiotracer within the liver. Counts are present
within the small bowel by 15 minutes. The gallbladder fails to fill
at the 90 minutes mark. Morphine was administered IV to demonstrate
patency of the cystic duct. Difficult to tell if the gallbladder
fills as the hepatic flexure of the colon is immediately adjacent to
the gallbladder and contains excreted radiotracer. NO CLEAR EVIDENCE
OF FILLING OF THE GALLBLADDER.
IMPRESSION: 1. No clear demonstration of patency of cystic duct. Recommend
clinical correlation for cystic duct obstruction (ACUTE
CHOLECYSTITIS).
2. Ejection fraction cannot be calculated as the gallbladder did not
fill over a 90 minute interval. None gallbladder filling over this
time interval could indicate chronic cholecystitis.
These results will be called to the ordering clinician or
representative by the Radiologist Assistant, and communication
documented in the PACS or zVision Dashboard.

## 2017-10-08 ENCOUNTER — Other Ambulatory Visit: Payer: Self-pay | Admitting: Rheumatology

## 2017-10-09 NOTE — Telephone Encounter (Signed)
Last Visit: 10/06/17 Next Visit: 02/18/18 Labs: 09/19/17 WNL  Okay to refill per Dr. Estanislado Pandy

## 2017-10-24 ENCOUNTER — Telehealth: Payer: Self-pay | Admitting: Rheumatology

## 2017-10-24 MED ORDER — FOLIC ACID 1 MG PO TABS: 1.0000 mg | ORAL_TABLET | Freq: Every evening | ORAL | 3 refills | Status: AC

## 2017-10-24 MED ORDER — FOLIC ACID 1 MG PO TABS: 1.0000 mg | ORAL_TABLET | Freq: Every evening | ORAL | 3 refills | Status: DC

## 2017-10-24 NOTE — Telephone Encounter (Signed)
Last Visit: 10/06/17 Next Visit: 02/18/18  Okay to refill per Dr. Estanislado Pandy

## 2017-10-24 NOTE — Telephone Encounter (Signed)
Patient request a refill for Folic Acid sent to [facility]. Patient has 6 days left. Please call with concerns.

## 2017-12-15 DIAGNOSIS — H2703 Aphakia, bilateral: Secondary | ICD-10-CM | POA: Diagnosis not present

## 2017-12-15 DIAGNOSIS — E119 Type 2 diabetes mellitus without complications: Secondary | ICD-10-CM | POA: Diagnosis not present

## 2017-12-19 ENCOUNTER — Telehealth: Payer: Self-pay | Admitting: Rheumatology

## 2017-12-19 DIAGNOSIS — Z79899 Other long term (current) drug therapy: Secondary | ICD-10-CM

## 2017-12-19 NOTE — Telephone Encounter (Signed)
Lab orders faxed.

## 2017-12-19 NOTE — Telephone Encounter (Signed)
Patient going to [facility] Monday for appt, and labs. Patient wants our lab orders to be sent there for him. Please call with questions. Dr. Rayfield Citizen at [facility].

## 2017-12-22 DIAGNOSIS — M109 Gout, unspecified: Secondary | ICD-10-CM | POA: Diagnosis not present

## 2017-12-22 DIAGNOSIS — E1151 Type 2 diabetes mellitus with diabetic peripheral angiopathy without gangrene: Secondary | ICD-10-CM | POA: Diagnosis not present

## 2017-12-25 DIAGNOSIS — B351 Tinea unguium: Secondary | ICD-10-CM | POA: Diagnosis not present

## 2017-12-25 DIAGNOSIS — E1159 Type 2 diabetes mellitus with other circulatory complications: Secondary | ICD-10-CM | POA: Diagnosis not present

## 2017-12-25 DIAGNOSIS — Z6827 Body mass index (BMI) 27.0-27.9, adult: Secondary | ICD-10-CM | POA: Diagnosis not present

## 2017-12-25 DIAGNOSIS — B353 Tinea pedis: Secondary | ICD-10-CM | POA: Diagnosis not present

## 2018-02-04 NOTE — Progress Notes (Signed)
Office Visit Note  Patient: Dan Holmes             Date of Birth: November 14, 1931           MRN: [id number]             PCP: Myer Peer, MD Referring: Myer Peer, MD Visit Date: 02/18/2018 Occupation: @GUAROCC @    Subjective:  Arthralgias   History of Present Illness: Dan Holmes is a 84 y.o. male with history of psoriatic arthritis, gout, DDD, and osteoporosis.  Patient is on methotrexate 3 tablets by mouth once weekly and folic acid 1 mg daily.  Patient reports that he has been having increased pain in multiple joints including bilateral hands and bilateral knees.  He denies any joint swelling at this time.  He states that he has had some increased stiffness in his bilateral hands.  Is also had some stiffness in his neck but denies any neck pain at this time.  He denies any active psoriasis.  He denies any Achilles tendinitis or plantar fasciitis.  He denies any SI joint pain.  He will occasionally have discomfort in his lower back.  He denies any gout flares in many years.  He continues take allopurinol 300 mg by mouth daily.   Activities of Daily Living:  Patient reports morning stiffness for 5 minutes.   Patient Denies nocturnal pain.  Difficulty dressing/grooming: Denies Difficulty climbing stairs: Denies Difficulty getting out of chair: Denies Difficulty using hands for taps, buttons, cutlery, and/or writing: Denies   Review of Systems  Constitutional: Positive for fatigue. Negative for night sweats.  HENT: Negative for mouth sores, trouble swallowing, trouble swallowing, mouth dryness and nose dryness.   Eyes: Positive for dryness (uses eye drops PRN). Negative for redness.  Respiratory: Negative for cough, hemoptysis, shortness of breath and difficulty breathing.   Cardiovascular: Negative for chest pain, palpitations, hypertension, irregular heartbeat and swelling in legs/feet.  Gastrointestinal: Positive for constipation (Take stool softener PRN).  Negative for blood in stool and diarrhea.  Endocrine: Negative for increased urination.  Genitourinary: Negative for painful urination.  Musculoskeletal: Positive for arthralgias, joint pain and morning stiffness. Negative for joint swelling, myalgias, muscle weakness, muscle tenderness and myalgias.  Skin: Positive for rash (Tinea pedis). Negative for color change, hair loss, nodules/bumps, skin tightness, ulcers and sensitivity to sunlight.  Allergic/Immunologic: Negative for susceptible to infections.  Neurological: Negative for dizziness, fainting, memory loss, night sweats and weakness.  Hematological: Negative for swollen glands.  Psychiatric/Behavioral: Negative for depressed mood and sleep disturbance. The patient is not nervous/anxious.     PMFS History:  Patient Active Problem List   Diagnosis Date Noted  . Old MI (myocardial infarction) 02/12/2018  . Cholecystitis 11/13/2016  . Psoriasis 06/19/2016  . DDD (degenerative disc disease), lumbar 06/19/2016  . DDD (degenerative disc disease), cervical 06/19/2016  . Osteoporosis 06/19/2016  . Kidney stones 06/19/2016  . 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 High risk medications (not anticoagulants) long-term use 06/19/2016  . Chronic gout without tophus 06/19/2016  . Abnormal LFTs   . Pancreatitis, acute   . Pancreatitis 01/27/2016  . Abdominal pain 01/27/2016  . Coronary artery disease involving native coronary artery of native heart 06/28/2015  . Essential hypertension 06/28/2015  . Hyperlipidemia 06/28/2015  . Cough due to ACE inhibitor 12/16/2014  . Dyslipidemia 05/28/2013  . Ejection fraction   . Psoriatic arthritis ([place])   . Renal insufficiency   . Diabetes mellitus   . Hx of CABG   .  Carotid artery disease ([place])   . Pain 02/16/2010    Past Medical History:  Diagnosis Date  . Abdominal pain 01/27/2016  . Abnormal LFTs   . Arthritis   . ASCVD (arteriosclerotic cardiovascular disease)   . CAD (coronary artery  disease)    January, 2012, nuclear, EF 61%, normal  . Carotid artery disease ([place])    Doppler, August, 2011, 30-09% RIC A., 2-33% LICA, followup one year  . Carpal tunnel syndrome   . Cholecystitis 11/13/2016  . Chronic gout without tophus 06/19/2016  . Coronary artery disease involving native coronary artery of native heart 06/28/2015   January, 2012, nuclear, EF 61%, normal  . Cough due to ACE inhibitor 12/16/2014   The patient's case was changed to an ARB in March, 2016.   . DDD (degenerative disc disease), cervical 06/19/2016  . DDD (degenerative disc disease), lumbar 06/19/2016   Spinal Fusion  . Diabetes mellitus   . Dyslipidemia 05/28/2013   Joint pains that may have been related to Crestor.   . Ejection fraction   . Essential hypertension 06/28/2015  . Gout   . High risk medications (not anticoagulants) long-term use 06/19/2016  . HTN (hypertension), benign   . Hx of CABG    2005  . Hyperlipidemia   . Kidney stones 06/19/2016  . Old MI (myocardial infarction) 02/12/2018  . Osteoporosis 06/19/2016  . Pain 02/2010   right neck, right shoulder, right wrist, right leg-not cardiac  . Pancreatitis 01/27/2016  . Pancreatitis, acute   . 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 Psoriasis 06/19/2016  . Psoriatic arthritis ([place])    2012  . Renal insufficiency     Family History  Problem Relation Age of Onset  . CVA Mother   . Stroke Mother   . Heart attack Father   . Heart disease Brother    Past Surgical History:  Procedure Laterality Date  . CHOLECYSTECTOMY    . CORONARY ARTERY BYPASS GRAFT  2005  . CORONARY ARTERY BYPASS GRAFT    . SPINAL FUSION     Social History   Social History Narrative   Level of education: High school    Employment: retired, [employer] maintenance         Objective: Vital Signs: BP (!) 167/90 (BP Location: Right Arm, Patient Position: Sitting, Cuff Size: Normal)   Pulse 68   Resp 14   Ht 5\' 10"  (1.778 m)   Wt 191 lb (86.6 kg)   BMI 27.41 kg/m     Physical Exam  Constitutional: He is oriented to person, place, and time. He appears well-developed and well-nourished.  HENT:  Head: Normocephalic and atraumatic.  Eyes: Pupils are equal, round, and reactive to light. Conjunctivae and EOM are normal.  Neck: Normal range of motion. Neck supple.  Cardiovascular: Normal rate, regular rhythm and normal heart sounds.  Pulmonary/Chest: Effort normal and breath sounds normal.  Abdominal: Soft. Bowel sounds are normal.  Lymphadenopathy:    He has no cervical adenopathy.  Neurological: He is alert and oriented to person, place, and time.  Skin: Skin is warm and dry. Capillary refill takes less than 2 seconds.  Psychiatric: He has a normal mood and affect. His behavior is normal.  Nursing note and vitals reviewed.    Musculoskeletal Exam: C-spine limited range of motion with lateral rotation.  Thoracic and lumbar spine limited range of motion.  No midline spinal tenderness.  No SI joint tenderness.  Left shoulder limited abduction with no discomfort.  Right shoulder good  range of motion.  Elbow joints good range of motion.  Limited range of motion of bilateral wrist joints.  She has synovial thickening of PIP and DIP joints bilaterally.  She is complete fist formation bilaterally.  MCPs, PIPs, DIPs good range of motion with no synovitis.  Hip joints, knee joints, ankle joints, MTPs, PIPs, DIPs good range of motion with no synovitis.  No warmth or effusion of bilateral knee joints.  No knee crepitus.  No tenderness of trochanteric bursa bilaterally.   CDAI Exam: CDAI Homunculus Exam:   Joint Counts:  CDAI Tender Joint count: 0 CDAI Swollen Joint count: 0  Global Assessments:  Patient Global Assessment: 6 Provider Global Assessment: 6  CDAI Calculated Score: 12    Investigation: No additional findings. CBC Latest Ref Rng & Units 11/19/2016 06/14/2016 04/16/2016  WBC 3.8 - 10.8 K/uL 8.5 6.7 6.8  Hemoglobin 13.2 - 17.1 g/dL 13.7 13.0  13.2(A)  Hematocrit 38.5 - 50.0 % 42.3 38.8 40(A)  Platelets 140 - 400 K/uL 195 194 201   CMP Latest Ref Rng & Units 11/19/2016 06/14/2016 04/16/2016  Glucose 65 - 99 mg/dL 144(H) 174(H) -  BUN 7 - 25 mg/dL 22 24 13   Creatinine 0.70 - 1.11 mg/dL 1.34(H) 1.50(H) 1.3  Sodium 135 - 146 mmol/L 138 141 138  Potassium 3.5 - 5.3 mmol/L 4.8 4.8 5.0  Chloride 98 - 110 mmol/L 103 101 -  CO2 20 - 31 mmol/L 23 23 -  Calcium 8.6 - 10.3 mg/dL 9.3 9.7 -  Total Protein 6.1 - 8.1 g/dL 6.7 6.9 -  Total Bilirubin 0.2 - 1.2 mg/dL 0.8 0.7 -  Alkaline Phos 40 - 115 U/L 67 72 66  AST 10 - 35 U/L 20 16 13(A)  ALT 9 - 46 U/L 13 12 10      Imaging: No results found.  Speciality Comments: No specialty comments available.    Procedures:  No procedures performed Allergies: Sulfa antibiotics   Assessment / Plan:     Visit Diagnoses: Psoriatic arthritis ([place]): He has no synovitis or dactylitis on exam.  He has some tenderness of PIP joints on exam.  No plantar fasciitis or Achilles tendinitis.  No tenderness of bilateral SI joints on exam.  He has been having increased joint pain and joint stiffness.  He denies any joint swelling.  He would like to increase his dose of methotrexate from 3 tablets once weekly to 4 tablets once weekly.  A refill of MTX was sent to the pharmacy today.  He was advised to notify us if he develops increased joint pain or joint swelling.  We will continue to monitor his labs closely.  LFTs were within normal limits on 12/25/2017.  He was advised to decrease his methotrexate to 3 tablets once weekly if he does not notice any benefit after increasing to 4 tablets once weekly.  He will return in 5 months for follow-up visit.  Psoriasis: He has no active psoriasis at this time.  He has nail dystrophy.  He has tinea pedis bilaterally, and he applies topical cream on a regular basis.  High risk medications (not anticoagulants) long-term use - MTX 4 tablets and folic acid 1 mg.  A refill of  methotrexate was sent to the pharmacy today.  CBC and CMP were drawn on 12/25/2017.  Labs were stable.  LFTs are within normal limits.  Idiopathic chronic gout of multiple sites without tophus -He has not had any recent gout flares.  He continues take allopurinol 300  mg by mouth daily.  He has been very compliant with his medications.  He does not need any refills of allopurinol at this time.  His uric acid was last checked on 12/24/2017 and was 5.4.   DDD (degenerative disc disease), cervical: He has limited range of motion of his C-spine especially with lateral rotation.  He has no discomfort in his neck at this time.  He will occasionally have increased neck stiffness.  DDD (degenerative disc disease), lumbar: Status post fusion, He has limited range of motion of his lumbar spine.  No midline spinal tenderness.  Age-related osteoporosis without current pathological fracture - He takes Vitamin D 5,000 units daily.  His PCP ordered his DEXA scan's.  Other medical conditions are listed as follows:  Parsonage-Turner syndrome  Renal insufficiency  Hx of CABG  Dyslipidemia  Essential hypertension  History of cholecystectomy    Orders: No orders of the defined types were placed in this encounter.  Meds ordered this encounter  Medications  . methotrexate (RHEUMATREX) 2.5 MG tablet    Sig: TAKE 4 TABLETS  BY MOUTH ONCE A WEEK. (CAUTION CHEMOTHERAPY, PROTECT FROM LIGHT)    Dispense:  48 tablet    Refill:  0    Face-to-face time spent with patient was 30 minutes. >50% of time was spent in counseling and coordination of care.  Follow-Up Instructions: Return in about 5 months (around 07/21/2018) for Psoriatic arthritis, Gout, DDD.   Ofilia Neas, PA-C  Note - This record has been created using Dragon software.  Chart creation errors have been sought, but may not always  have been located. Such creation errors do not reflect on  the standard of medical care.

## 2018-02-12 DIAGNOSIS — I252 Old myocardial infarction: Secondary | ICD-10-CM

## 2018-02-12 HISTORY — DX: Old myocardial infarction: I25.2

## 2018-02-18 ENCOUNTER — Encounter: Payer: Self-pay | Admitting: Physician Assistant

## 2018-02-18 ENCOUNTER — Ambulatory Visit: Payer: Medicare HMO | Admitting: Physician Assistant

## 2018-02-18 VITALS — BP 167/90 | HR 68 | Resp 14 | Ht 70.0 in | Wt 191.0 lb

## 2018-02-18 DIAGNOSIS — M5136 Other intervertebral disc degeneration, lumbar region: Secondary | ICD-10-CM | POA: Diagnosis not present

## 2018-02-18 DIAGNOSIS — E785 Hyperlipidemia, unspecified: Secondary | ICD-10-CM

## 2018-02-18 DIAGNOSIS — L409 Psoriasis, unspecified: Secondary | ICD-10-CM | POA: Diagnosis not present

## 2018-02-18 DIAGNOSIS — I10 Essential (primary) hypertension: Secondary | ICD-10-CM | POA: Diagnosis not present

## 2018-02-18 DIAGNOSIS — Z79899 Other long term (current) drug therapy: Secondary | ICD-10-CM

## 2018-02-18 DIAGNOSIS — L4050 Arthropathic psoriasis, unspecified: Secondary | ICD-10-CM | POA: Diagnosis not present

## 2018-02-18 DIAGNOSIS — Z951 Presence of aortocoronary bypass graft: Secondary | ICD-10-CM | POA: Diagnosis not present

## 2018-02-18 DIAGNOSIS — M5030 Other cervical disc degeneration, unspecified cervical region: Secondary | ICD-10-CM

## 2018-02-18 DIAGNOSIS — M810 Age-related osteoporosis without current pathological fracture: Secondary | ICD-10-CM | POA: Diagnosis not present

## 2018-02-18 DIAGNOSIS — N289 Disorder of kidney and ureter, unspecified: Secondary | ICD-10-CM | POA: Diagnosis not present

## 2018-02-18 DIAGNOSIS — M1A09X0 Idiopathic chronic gout, multiple sites, without tophus (tophi): Secondary | ICD-10-CM | POA: Diagnosis not present

## 2018-02-18 DIAGNOSIS — Z9049 Acquired absence of other specified parts of digestive tract: Secondary | ICD-10-CM

## 2018-02-18 DIAGNOSIS — G545 Neuralgic amyotrophy: Secondary | ICD-10-CM

## 2018-02-18 MED ORDER — METHOTREXATE 2.5 MG PO TABS: ORAL_TABLET | ORAL | 0 refills | Status: DC

## 2018-02-18 NOTE — Patient Instructions (Signed)
Standing Labs We placed an order today for your standing lab work.    Please come back and get your standing labs in August and every 3 months   We have open lab Monday through Friday from 8:30-11:30 AM and 1:30-4:00 PM  at the office of Dr. Shaili Deveshwar.   You may experience shorter wait times on Monday and Friday afternoons. The office is located at [address], [address], [place], [place] [address] No appointment is necessary.   Labs are drawn by [facility].  You may receive a bill from [facility] for your lab work. If you have any questions regarding directions or hours of operation,  please call [phone].    

## 2018-02-26 NOTE — Progress Notes (Signed)
Cardiology Office Note:    Date:  02/27/2018   ID:  Dan Holmes, DOB September 14, 1931, MRN [id number]  PCP:  Myer Peer, MD  Cardiologist:  Shirlee More, MD    Referring MD: Myer Peer, MD    ASSESSMENT:    1. Coronary artery disease of native artery of native heart with stable angina pectoris ([place])   2. Hx of CABG   3. Dyslipidemia   4. Essential hypertension   5. Bilateral carotid artery stenosis    PLAN:    In order of problems listed above:  1. Is now approaching 15 years from CABG and does have recurrent exertional angina for risk stratification will undergo a exercise Myoview study and if he has significant ischemia would benefit from left heart cath PCI and further revascularization.  For now we will continue medical treatment including aspirin beta-blocker statin. 2. See above he is having recurrent ischemia 3. Continue his statin labs requested from his PCP office 4. Stable continue current treatment including ACE inhibitor 5. Asymptomatic but at risk for progression in 3 years since his last duplex significant greater than 80% stenosis he be considered for revascularization and would need a vascular surgery consultation.  Carotid duplex ordered   Next appointment: One month   Medication Adjustments/Labs and Tests Ordered: Current medicines are reviewed at length with the patient today.  Concerns regarding medicines are outlined above.  No orders of the defined types were placed in this encounter.  No orders of the defined types were placed in this encounter.   Chief Complaint  Patient presents with  . Follow-up    CABG 2005  . Coronary Artery Disease  . Hypertension  . Hyperlipidemia    History of Present Illness:    Dan Holmes is a 84 y.o. male with a hx of CAD CABG 2005 hypertension and dyslipidemia and carotid stenosis mild bilateral ast seen 10/03/16. Compliance with diet, lifestyle and medications: Yes He has known carotid  stenosis and is not having TIA or vascular insufficiency symptoms like amaurosis fugax.  He notices that after especially his evening meal when he does heavy outdoor activity such as gardening work he develops chest tightness is mild and is not severe and limiting present for some to rest but this is new and a recurrent problem.  He has not used nitroglycerin as of yet.  No shortness of breath palpitation or syncope.  Unfortunately he does not exercise regularly and except for garden work has a fairly sedentary lifestyle Past Medical History:  Diagnosis Date  . Abdominal pain 01/27/2016  . Abnormal LFTs   . Arthritis   . ASCVD (arteriosclerotic cardiovascular disease)   . CAD (coronary artery disease)    January, 2012, nuclear, EF 61%, normal  . Carotid artery disease ([place])    Doppler, August, 2011, 97-98% RIC A., 9-21% LICA, followup one year  . Carpal tunnel syndrome   . Cholecystitis 11/13/2016  . Chronic gout without tophus 06/19/2016  . Coronary artery disease involving native coronary artery of native heart 06/28/2015   January, 2012, nuclear, EF 61%, normal  . Cough due to ACE inhibitor 12/16/2014   The patient's case was changed to an ARB in March, 2016.   . DDD (degenerative disc disease), cervical 06/19/2016  . DDD (degenerative disc disease), lumbar 06/19/2016   Spinal Fusion  . Diabetes mellitus   . Dyslipidemia 05/28/2013   Joint pains that may have been related to Crestor.   . Ejection fraction   .  Essential hypertension 06/28/2015  . Gout   . High risk medications (not anticoagulants) long-term use 06/19/2016  . HTN (hypertension), benign   . Hx of CABG    2005  . Hyperlipidemia   . Kidney stones 06/19/2016  . Old MI (myocardial infarction) 02/12/2018  . Osteoporosis 06/19/2016  . Pain 02/2010   right neck, right shoulder, right wrist, right leg-not cardiac  . Pancreatitis 01/27/2016  . Pancreatitis, acute   . 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 Psoriasis 06/19/2016  .  Psoriatic arthritis ([place])    2012  . Renal insufficiency     Past Surgical History:  Procedure Laterality Date  . CARDIAC CATHETERIZATION    . CHOLECYSTECTOMY    . CORONARY ARTERY BYPASS GRAFT  2005  . CORONARY ARTERY BYPASS GRAFT    . SPINAL FUSION      Current Medications: Current Meds  Medication Sig  . allopurinol (ZYLOPRIM) 300 MG tablet Take 1 tablet (300 mg total) by mouth daily.  Marland Kitchen aspirin 81 MG tablet Take 81 mg by mouth every morning.   . carvedilol (COREG) 25 MG tablet Take 25 mg by mouth 2 (two) times daily with a meal.   . Cholecalciferol (VITAMIN D3) 5000 UNITS CAPS Take 5,000 Units by mouth every morning.   . docusate sodium (COLACE) 100 MG capsule Take 100 mg by mouth daily as needed for mild constipation.   . finasteride (PROSCAR) 5 MG tablet Take 5 mg by mouth every morning.   . folic acid (FOLVITE) 1 MG tablet Take 1 tablet (1 mg total) by mouth every evening.  Marland Kitchen lisinopril (PRINIVIL,ZESTRIL) 20 MG tablet Take 20 mg by mouth every evening.  . metFORMIN (GLUCOPHAGE) 1000 MG tablet Take 1,000 mg by mouth daily with breakfast.   . methotrexate (RHEUMATREX) 2.5 MG tablet TAKE 4 TABLETS  BY MOUTH ONCE A WEEK. (CAUTION CHEMOTHERAPY, PROTECT FROM LIGHT)  . simvastatin (ZOCOR) 40 MG tablet Take 40 mg by mouth at bedtime.     Allergies:   Sulfa antibiotics   Social History   Socioeconomic History  . Marital status: Married    Spouse name: Not on file  . Number of children: Not on file  . Years of education: Not on file  . Highest education level: Not on file  Occupational History  . Not on file  Social Needs  . Financial resource strain: Not on file  . Food insecurity:    Worry: Not on file    Inability: Not on file  . Transportation needs:    Medical: Not on file    Non-medical: Not on file  Tobacco Use  . Smoking status: Former Smoker    Packs/day: 0.50    Years: 10.00    Pack years: 5.00    Types: Cigarettes    Last attempt to quit: 03/21/1965     Years since quitting: 52.9  . Smokeless tobacco: Never Used  Substance and Sexual Activity  . Alcohol use: No    Alcohol/week: 0.0 oz  . Drug use: Never  . Sexual activity: Not on file  Lifestyle  . Physical activity:    Days per week: Not on file    Minutes per session: Not on file  . Stress: Not on file  Relationships  . Social connections:    Talks on phone: Not on file    Gets together: Not on file    Attends religious service: Not on file    Active member of club or organization: Not on file  Attends meetings of clubs or organizations: Not on file    Relationship status: Not on file  Other Topics Concern  . Not on file  Social History Narrative   Level of education: High school    Employment: retired, [employer] maintenance         Family History: The patient's family history includes CVA in his mother; Heart attack in his father; Heart disease in his brother; Stroke in his mother. ROS:   Please see the history of present illness.    All other systems reviewed and are negative.  EKGs/Labs/Other Studies Reviewed:    The following studies were reviewed today:  EKG:  EKG ordered today.  The ekg ordered today demonstrates [facility] and normal  Recent Labs: No results found for requested labs within last 8760 hours.  Recent Lipid Panel   Chol 90 HDL 35 LDL 24 CMP normal 12/22/17    Component Value Date/Time   CHOL 93 01/28/2016 0453   TRIG 67 01/28/2016 0453   HDL 33 (L) 01/28/2016 0453   CHOLHDL 2.8 01/28/2016 0453   VLDL 13 01/28/2016 0453   LDLCALC 47 01/28/2016 0453    Physical Exam:    VS:  BP (!) 172/80 (BP Location: Right Arm, Patient Position: Sitting, Cuff Size: Normal)   Pulse 74   Ht 5\' 10"  (1.778 m)   Wt 188 lb (85.3 kg)   SpO2 97%   BMI 26.98 kg/m     Wt Readings from Last 3 Encounters:  02/27/18 188 lb (85.3 kg)  02/18/18 191 lb (86.6 kg)  10/06/17 192 lb (87.1 kg)     GEN:  Well nourished, well developed in no acute distress HEENT:  Normal NECK: No JVD; No carotid bruits LYMPHATICS: No lymphadenopathy CARDIAC: RRR, no murmurs, rubs, gallops RESPIRATORY:  Clear to auscultation without rales, wheezing or rhonchi  ABDOMEN: Soft, non-tender, non-distended MUSCULOSKELETAL:  No edema; No deformity  SKIN: Warm and dry NEUROLOGIC:  Alert and oriented x 3 PSYCHIATRIC:  Normal affect    Signed, Shirlee More, MD  02/27/2018 8:20 AM    [place]

## 2018-02-27 ENCOUNTER — Encounter: Payer: Self-pay | Admitting: Cardiology

## 2018-02-27 ENCOUNTER — Ambulatory Visit: Payer: Medicare HMO | Admitting: Cardiology

## 2018-02-27 VITALS — BP 172/80 | HR 74 | Ht 70.0 in | Wt 188.0 lb

## 2018-02-27 DIAGNOSIS — I10 Essential (primary) hypertension: Secondary | ICD-10-CM

## 2018-02-27 DIAGNOSIS — Z951 Presence of aortocoronary bypass graft: Secondary | ICD-10-CM

## 2018-02-27 DIAGNOSIS — I25118 Atherosclerotic heart disease of native coronary artery with other forms of angina pectoris: Secondary | ICD-10-CM

## 2018-02-27 DIAGNOSIS — I6523 Occlusion and stenosis of bilateral carotid arteries: Secondary | ICD-10-CM | POA: Diagnosis not present

## 2018-02-27 DIAGNOSIS — E785 Hyperlipidemia, unspecified: Secondary | ICD-10-CM | POA: Diagnosis not present

## 2018-02-27 NOTE — Patient Instructions (Addendum)
Medication Instructions:  Your physician recommends that you continue on your current medications as directed. Please refer to the Current Medication list given to you today.   Labwork: NONE   Testing/Procedures: You had an EKG today  Your physician has requested that you have en exercise stress myoview. For further information please visit [email or web address]. Please follow instruction sheet, as given.  Your physician has requested that you have a carotid duplex. This test is an ultrasound of the carotid arteries in your neck. It looks at blood flow through these arteries that supply the brain with blood. Allow one hour for this exam. There are no restrictions or special instructions.     Follow-Up: Your physician recommends that you schedule a follow-up appointment in: 1 month   Any Other Special Instructions Will Be Listed Below (If Applicable).     If you need a refill on your cardiac medications before your next appointment, please call your pharmacy.

## 2018-03-02 ENCOUNTER — Telehealth (HOSPITAL_COMMUNITY): Payer: Self-pay | Admitting: *Deleted

## 2018-03-02 NOTE — Telephone Encounter (Signed)
Patient given detailed instructions per Myocardial Perfusion Study Information Sheet for the test on 03/04/18. Patient notified to arrive 15 minutes early and that it is imperative to arrive on time for appointment to keep from having the test rescheduled.  If you need to cancel or reschedule your appointment, please call the office within 24 hours of your appointment. . Patient verbalized understanding. Kirstie Peri

## 2018-03-04 ENCOUNTER — Ambulatory Visit (HOSPITAL_COMMUNITY): Payer: Medicare HMO | Attending: Cardiology

## 2018-03-04 ENCOUNTER — Telehealth: Payer: Self-pay

## 2018-03-04 DIAGNOSIS — Z951 Presence of aortocoronary bypass graft: Secondary | ICD-10-CM | POA: Diagnosis not present

## 2018-03-04 DIAGNOSIS — I25118 Atherosclerotic heart disease of native coronary artery with other forms of angina pectoris: Secondary | ICD-10-CM | POA: Diagnosis not present

## 2018-03-04 DIAGNOSIS — R9439 Abnormal result of other cardiovascular function study: Secondary | ICD-10-CM | POA: Diagnosis not present

## 2018-03-04 LAB — MYOCARDIAL PERFUSION IMAGING
CHL CUP MPHR: 137 {beats}/min
CHL CUP NUCLEAR SDS: 1
CSEPED: 4 min
CSEPEDS: 27 s
CSEPPHR: 162 {beats}/min
Estimated workload: 5.1 METS
LV dias vol: 102 mL (ref 62–150)
LVSYSVOL: 47 mL
Percent HR: 118 %
RATE: 0.33
Rest HR: 78 {beats}/min
SRS: 1
SSS: 2
TID: 0.88

## 2018-03-04 MED ORDER — TECHNETIUM TC 99M TETROFOSMIN IV KIT
32.3000 | PACK | Freq: Once | INTRAVENOUS | Status: AC | PRN
  Administered 2018-03-04: 32.3 via INTRAVENOUS
  Filled 2018-03-04: qty 33

## 2018-03-04 MED ORDER — TECHNETIUM TC 99M TETROFOSMIN IV KIT
11.0000 | PACK | Freq: Once | INTRAVENOUS | Status: AC | PRN
  Administered 2018-03-04: 11 via INTRAVENOUS
  Filled 2018-03-04: qty 11

## 2018-03-04 NOTE — Telephone Encounter (Signed)
Attempted to reach patient to discuss normal exercise myoview results, but phone number was busy.  Will make attempts at a later time

## 2018-03-05 DIAGNOSIS — Z6826 Body mass index (BMI) 26.0-26.9, adult: Secondary | ICD-10-CM | POA: Diagnosis not present

## 2018-03-05 DIAGNOSIS — M47812 Spondylosis without myelopathy or radiculopathy, cervical region: Secondary | ICD-10-CM | POA: Diagnosis not present

## 2018-03-06 NOTE — Telephone Encounter (Signed)
Patient informed of results.  

## 2018-04-06 ENCOUNTER — Ambulatory Visit: Payer: Medicare HMO | Admitting: Cardiology

## 2018-04-16 ENCOUNTER — Ambulatory Visit (INDEPENDENT_AMBULATORY_CARE_PROVIDER_SITE_OTHER): Payer: Medicare HMO

## 2018-04-16 DIAGNOSIS — I6523 Occlusion and stenosis of bilateral carotid arteries: Secondary | ICD-10-CM | POA: Diagnosis not present

## 2018-04-16 NOTE — Progress Notes (Signed)
Carotid duplex has been performed. Bilateral ICA stenosis was evaluated.   [place]

## 2018-04-23 ENCOUNTER — Telehealth: Payer: Self-pay | Admitting: *Deleted

## 2018-04-23 DIAGNOSIS — N289 Disorder of kidney and ureter, unspecified: Secondary | ICD-10-CM

## 2018-04-23 DIAGNOSIS — I739 Peripheral vascular disease, unspecified: Principal | ICD-10-CM

## 2018-04-23 DIAGNOSIS — R9389 Abnormal findings on diagnostic imaging of other specified body structures: Secondary | ICD-10-CM

## 2018-04-23 DIAGNOSIS — I779 Disorder of arteries and arterioles, unspecified: Secondary | ICD-10-CM

## 2018-04-23 DIAGNOSIS — I10 Essential (primary) hypertension: Secondary | ICD-10-CM

## 2018-04-23 HISTORY — DX: Abnormal findings on diagnostic imaging of other specified body structures: R93.89

## 2018-04-23 NOTE — Telephone Encounter (Signed)
Patient informed of carotid ultrasound results and advised that Dr. Bettina Gavia recommends a neck CTA at this time. Patient agreeable and verbalized understanding. Informed patient that he would be contacted with appointment date and time at [facility]. Patient requested to have testing done before November if possible. No further questions.

## 2018-04-27 NOTE — Telephone Encounter (Signed)
Patient stopped by the office this morning to get clarification on neck CTA that is scheduled for tomorrow at [facility]. Patient is worried that [facility] is going to ask him to pay "a bunch of extra money to have testing done there." Patient is expecting a call from [place] this afternoon with answers regarding how much it is going to cost. Patient states if it is going to cost too much, then he would call us and we would reschedule to happen through [facility]. No further questions.

## 2018-05-01 NOTE — Telephone Encounter (Signed)
Patient stopped by the office this morning stating that he did not end up having testing done at [facility]. He states he wants to have the CTA neck done at the [facility] instead. Patient has been scheduled on 05/13/18 at 9:00 am. Instructed patient not to eat any solid food two hours before testing and to arrive at 8:45 am on the third floor. Reminded patient to come in to our office for lab work at the end of next week, no appointment needed. Patient verbalized understanding. No further questions.

## 2018-05-08 DIAGNOSIS — I10 Essential (primary) hypertension: Secondary | ICD-10-CM | POA: Diagnosis not present

## 2018-05-08 DIAGNOSIS — N289 Disorder of kidney and ureter, unspecified: Secondary | ICD-10-CM | POA: Diagnosis not present

## 2018-05-08 DIAGNOSIS — I779 Disorder of arteries and arterioles, unspecified: Secondary | ICD-10-CM | POA: Diagnosis not present

## 2018-05-08 LAB — BASIC METABOLIC PANEL
BUN/Creatinine Ratio: 15 (ref 10–24)
BUN: 19 mg/dL (ref 8–27)
CHLORIDE: 100 mmol/L (ref 96–106)
CO2: 25 mmol/L (ref 20–29)
Calcium: 9.4 mg/dL (ref 8.6–10.2)
Creatinine, Ser: 1.28 mg/dL — ABNORMAL HIGH (ref 0.76–1.27)
GFR, EST AFRICAN AMERICAN: 59 mL/min/{1.73_m2} — AB (ref >59)
GFR, EST NON AFRICAN AMERICAN: 51 mL/min/{1.73_m2} — AB (ref >59)
Glucose: 137 mg/dL — ABNORMAL HIGH (ref 65–99)
POTASSIUM: 4.4 mmol/L (ref 3.5–5.2)
SODIUM: 139 mmol/L (ref 134–144)

## 2018-05-13 ENCOUNTER — Ambulatory Visit (INDEPENDENT_AMBULATORY_CARE_PROVIDER_SITE_OTHER)
Admission: RE | Admit: 2018-05-13 | Discharge: 2018-05-13 | Disposition: A | Discharge status: Home / Self Care | Payer: Medicare HMO | Source: Ambulatory Visit | Attending: Cardiology | Admitting: Cardiology

## 2018-05-13 ENCOUNTER — Telehealth: Payer: Self-pay | Admitting: Rheumatology

## 2018-05-13 DIAGNOSIS — I6523 Occlusion and stenosis of bilateral carotid arteries: Secondary | ICD-10-CM | POA: Diagnosis not present

## 2018-05-13 DIAGNOSIS — R9389 Abnormal findings on diagnostic imaging of other specified body structures: Secondary | ICD-10-CM | POA: Diagnosis not present

## 2018-05-13 DIAGNOSIS — I779 Disorder of arteries and arterioles, unspecified: Secondary | ICD-10-CM

## 2018-05-13 MED ORDER — IOPAMIDOL (ISOVUE-370) INJECTION 76%
80.0000 mL | Freq: Once | INTRAVENOUS | Status: AC | PRN
  Administered 2018-05-13: 80 mL via INTRAVENOUS

## 2018-05-13 NOTE — Telephone Encounter (Signed)
Last Visit: 02/18/18 Next Visit: 06/15/18 Labs: 12/22/17 Elevated glucose   Attempted to contact the patient to advise him he is due for labs. Unable to leave a message. Message comes on stating to please enter your remote access code.

## 2018-05-13 NOTE — Telephone Encounter (Signed)
Patient advised he is due for labs. Patient declined because his copay is $40. Patient states he has 2 weeks of medication left and has an appointment with [place] on 06/15/18. Patient advised that we need labs update every 3 months to keep his medication refilled. His labs were on 12/22/17.

## 2018-05-13 NOTE — Telephone Encounter (Signed)
Patient request a refill on MTX sent to [facility].

## 2018-05-13 NOTE — Telephone Encounter (Signed)
He would not be able to continue medication without monitoring of labs.

## 2018-06-01 DIAGNOSIS — E1159 Type 2 diabetes mellitus with other circulatory complications: Secondary | ICD-10-CM | POA: Diagnosis not present

## 2018-06-01 DIAGNOSIS — M109 Gout, unspecified: Secondary | ICD-10-CM | POA: Diagnosis not present

## 2018-06-08 DIAGNOSIS — I2510 Atherosclerotic heart disease of native coronary artery without angina pectoris: Secondary | ICD-10-CM | POA: Diagnosis not present

## 2018-06-08 DIAGNOSIS — E118 Type 2 diabetes mellitus with unspecified complications: Secondary | ICD-10-CM | POA: Diagnosis not present

## 2018-06-08 DIAGNOSIS — Z23 Encounter for immunization: Secondary | ICD-10-CM | POA: Diagnosis not present

## 2018-06-08 DIAGNOSIS — Z0000 Encounter for general adult medical examination without abnormal findings: Secondary | ICD-10-CM | POA: Diagnosis not present

## 2018-06-08 DIAGNOSIS — Z9181 History of falling: Secondary | ICD-10-CM | POA: Diagnosis not present

## 2018-06-08 DIAGNOSIS — N183 Chronic kidney disease, stage 3 (moderate): Secondary | ICD-10-CM | POA: Diagnosis not present

## 2018-06-08 DIAGNOSIS — Z1331 Encounter for screening for depression: Secondary | ICD-10-CM | POA: Diagnosis not present

## 2018-06-08 DIAGNOSIS — E1165 Type 2 diabetes mellitus with hyperglycemia: Secondary | ICD-10-CM | POA: Diagnosis not present

## 2018-06-08 DIAGNOSIS — L4050 Arthropathic psoriasis, unspecified: Secondary | ICD-10-CM | POA: Diagnosis not present

## 2018-06-15 ENCOUNTER — Ambulatory Visit: Payer: Medicare HMO | Admitting: Physician Assistant

## 2018-10-05 DIAGNOSIS — M109 Gout, unspecified: Secondary | ICD-10-CM | POA: Diagnosis not present

## 2018-10-05 DIAGNOSIS — L4050 Arthropathic psoriasis, unspecified: Secondary | ICD-10-CM | POA: Diagnosis not present

## 2018-10-05 DIAGNOSIS — Z79899 Other long term (current) drug therapy: Secondary | ICD-10-CM | POA: Diagnosis not present

## 2018-10-05 DIAGNOSIS — E1151 Type 2 diabetes mellitus with diabetic peripheral angiopathy without gangrene: Secondary | ICD-10-CM | POA: Diagnosis not present

## 2018-10-12 DIAGNOSIS — E1151 Type 2 diabetes mellitus with diabetic peripheral angiopathy without gangrene: Secondary | ICD-10-CM | POA: Diagnosis not present

## 2018-10-12 DIAGNOSIS — L4050 Arthropathic psoriasis, unspecified: Secondary | ICD-10-CM | POA: Diagnosis not present

## 2018-10-12 DIAGNOSIS — N183 Chronic kidney disease, stage 3 (moderate): Secondary | ICD-10-CM | POA: Diagnosis not present

## 2018-10-12 DIAGNOSIS — M109 Gout, unspecified: Secondary | ICD-10-CM | POA: Diagnosis not present

## 2018-10-12 DIAGNOSIS — Z6826 Body mass index (BMI) 26.0-26.9, adult: Secondary | ICD-10-CM | POA: Diagnosis not present

## 2018-10-12 DIAGNOSIS — I10 Essential (primary) hypertension: Secondary | ICD-10-CM | POA: Diagnosis not present

## 2018-10-12 DIAGNOSIS — E663 Overweight: Secondary | ICD-10-CM | POA: Diagnosis not present

## 2018-10-12 DIAGNOSIS — E782 Mixed hyperlipidemia: Secondary | ICD-10-CM | POA: Diagnosis not present

## 2018-10-12 DIAGNOSIS — I2510 Atherosclerotic heart disease of native coronary artery without angina pectoris: Secondary | ICD-10-CM | POA: Diagnosis not present

## 2018-12-31 DIAGNOSIS — H2703 Aphakia, bilateral: Secondary | ICD-10-CM | POA: Diagnosis not present

## 2018-12-31 DIAGNOSIS — E119 Type 2 diabetes mellitus without complications: Secondary | ICD-10-CM | POA: Diagnosis not present

## 2019-02-03 DIAGNOSIS — L4050 Arthropathic psoriasis, unspecified: Secondary | ICD-10-CM | POA: Diagnosis not present

## 2019-02-03 DIAGNOSIS — E1151 Type 2 diabetes mellitus with diabetic peripheral angiopathy without gangrene: Secondary | ICD-10-CM | POA: Diagnosis not present

## 2019-02-03 DIAGNOSIS — M109 Gout, unspecified: Secondary | ICD-10-CM | POA: Diagnosis not present

## 2019-02-08 DIAGNOSIS — E663 Overweight: Secondary | ICD-10-CM | POA: Diagnosis not present

## 2019-02-08 DIAGNOSIS — L4050 Arthropathic psoriasis, unspecified: Secondary | ICD-10-CM | POA: Diagnosis not present

## 2019-02-08 DIAGNOSIS — E559 Vitamin D deficiency, unspecified: Secondary | ICD-10-CM | POA: Diagnosis not present

## 2019-02-08 DIAGNOSIS — M109 Gout, unspecified: Secondary | ICD-10-CM | POA: Diagnosis not present

## 2019-02-08 DIAGNOSIS — E1151 Type 2 diabetes mellitus with diabetic peripheral angiopathy without gangrene: Secondary | ICD-10-CM | POA: Diagnosis not present

## 2019-02-08 DIAGNOSIS — N183 Chronic kidney disease, stage 3 (moderate): Secondary | ICD-10-CM | POA: Diagnosis not present

## 2019-02-08 DIAGNOSIS — I10 Essential (primary) hypertension: Secondary | ICD-10-CM | POA: Diagnosis not present

## 2019-02-08 DIAGNOSIS — I2510 Atherosclerotic heart disease of native coronary artery without angina pectoris: Secondary | ICD-10-CM | POA: Diagnosis not present

## 2019-02-08 DIAGNOSIS — E782 Mixed hyperlipidemia: Secondary | ICD-10-CM | POA: Diagnosis not present

## 2019-06-07 DIAGNOSIS — L4050 Arthropathic psoriasis, unspecified: Secondary | ICD-10-CM | POA: Diagnosis not present

## 2019-06-07 DIAGNOSIS — E1151 Type 2 diabetes mellitus with diabetic peripheral angiopathy without gangrene: Secondary | ICD-10-CM | POA: Diagnosis not present

## 2019-06-07 DIAGNOSIS — M109 Gout, unspecified: Secondary | ICD-10-CM | POA: Diagnosis not present

## 2019-06-07 DIAGNOSIS — Z79899 Other long term (current) drug therapy: Secondary | ICD-10-CM | POA: Diagnosis not present

## 2019-06-07 DIAGNOSIS — E785 Hyperlipidemia, unspecified: Secondary | ICD-10-CM | POA: Diagnosis not present

## 2019-06-11 DIAGNOSIS — L4050 Arthropathic psoriasis, unspecified: Secondary | ICD-10-CM | POA: Diagnosis not present

## 2019-06-11 DIAGNOSIS — M109 Gout, unspecified: Secondary | ICD-10-CM | POA: Diagnosis not present

## 2019-06-11 DIAGNOSIS — N183 Chronic kidney disease, stage 3 unspecified: Secondary | ICD-10-CM | POA: Diagnosis not present

## 2019-06-11 DIAGNOSIS — E1151 Type 2 diabetes mellitus with diabetic peripheral angiopathy without gangrene: Secondary | ICD-10-CM | POA: Diagnosis not present

## 2019-06-11 DIAGNOSIS — I10 Essential (primary) hypertension: Secondary | ICD-10-CM | POA: Diagnosis not present

## 2019-06-11 DIAGNOSIS — I2510 Atherosclerotic heart disease of native coronary artery without angina pectoris: Secondary | ICD-10-CM | POA: Diagnosis not present

## 2019-06-11 DIAGNOSIS — Z23 Encounter for immunization: Secondary | ICD-10-CM | POA: Diagnosis not present

## 2019-06-11 DIAGNOSIS — E782 Mixed hyperlipidemia: Secondary | ICD-10-CM | POA: Diagnosis not present

## 2019-06-11 DIAGNOSIS — Z0000 Encounter for general adult medical examination without abnormal findings: Secondary | ICD-10-CM | POA: Diagnosis not present

## 2019-07-13 DIAGNOSIS — H2703 Aphakia, bilateral: Secondary | ICD-10-CM | POA: Diagnosis not present

## 2019-07-13 DIAGNOSIS — E119 Type 2 diabetes mellitus without complications: Secondary | ICD-10-CM | POA: Diagnosis not present

## 2019-07-28 ENCOUNTER — Telehealth: Payer: Self-pay | Admitting: Cardiology

## 2019-07-28 NOTE — Telephone Encounter (Signed)
Patient called asking why he has not been called about a followup, no recall put in. I offered [facility] first available in January, he said he might not last that long due to having chest pain. I offered another doctor or asked him to go to the ER. He stated he would call back

## 2019-10-06 DIAGNOSIS — M109 Gout, unspecified: Secondary | ICD-10-CM | POA: Diagnosis not present

## 2019-10-06 DIAGNOSIS — E782 Mixed hyperlipidemia: Secondary | ICD-10-CM | POA: Diagnosis not present

## 2019-10-06 DIAGNOSIS — E559 Vitamin D deficiency, unspecified: Secondary | ICD-10-CM | POA: Diagnosis not present

## 2019-10-06 DIAGNOSIS — Z79899 Other long term (current) drug therapy: Secondary | ICD-10-CM | POA: Diagnosis not present

## 2019-10-06 DIAGNOSIS — E1151 Type 2 diabetes mellitus with diabetic peripheral angiopathy without gangrene: Secondary | ICD-10-CM | POA: Diagnosis not present

## 2019-10-06 DIAGNOSIS — L4050 Arthropathic psoriasis, unspecified: Secondary | ICD-10-CM | POA: Diagnosis not present

## 2019-11-29 DIAGNOSIS — R040 Epistaxis: Secondary | ICD-10-CM | POA: Diagnosis not present

## 2019-12-02 DIAGNOSIS — J342 Deviated nasal septum: Secondary | ICD-10-CM | POA: Diagnosis not present

## 2019-12-02 DIAGNOSIS — R040 Epistaxis: Secondary | ICD-10-CM | POA: Diagnosis not present

## 2019-12-02 DIAGNOSIS — J3489 Other specified disorders of nose and nasal sinuses: Secondary | ICD-10-CM | POA: Diagnosis not present

## 2019-12-02 DIAGNOSIS — Z7982 Long term (current) use of aspirin: Secondary | ICD-10-CM | POA: Diagnosis not present

## 2019-12-16 DIAGNOSIS — Z7982 Long term (current) use of aspirin: Secondary | ICD-10-CM | POA: Diagnosis not present

## 2019-12-16 DIAGNOSIS — J342 Deviated nasal septum: Secondary | ICD-10-CM | POA: Diagnosis not present

## 2019-12-16 DIAGNOSIS — Z87898 Personal history of other specified conditions: Secondary | ICD-10-CM | POA: Diagnosis not present

## 2020-01-12 NOTE — Progress Notes (Signed)
Cardiology Office Note:    Date:  01/13/2020   ID:  Dan Holmes, DOB 09-20-1931, MRN [id number]  PCP:  Dan Peer, MD  Cardiologist:  Dan More, MD    Referring MD: Dan Peer, MD    ASSESSMENT:    1. Coronary artery disease of native artery of native heart with stable angina pectoris ([place])   2. Essential hypertension   3. Dyslipidemia   4. Bilateral carotid artery stenosis   5. Hx of CABG    PLAN:    In order of problems listed above:  1. He has stable CAD following remote CABG no angina on current medical treatment and will continue aspirin beta-blocker intermittently intensity statin.   Hypertension stable continue current treatment Hyperlipidemia stable LDL continues intensity statin with age 61 and CAD Carotid stenosis recheck duplex continue medical therapy  Next appointment: 1 year   Medication Adjustments/Labs and Tests Ordered: Current medicines are reviewed at length with the patient today.  Concerns regarding medicines are outlined above.  Orders Placed This Encounter  Procedures  . EKG 12-Lead  . VAS US CAROTID   No orders of the defined types were placed in this encounter.   Chief Complaint  Patient presents with  . Follow-up  . Coronary Artery Disease    History of Present Illness:    Dan Holmes is a 86 y.o. male with a hx of CAD CABG 2005 hypertension and dyslipidemia and carotid stenosis mild bilateral  last seen 02/27/2018. Compliance with diet, lifestyle and medications: Yes  He had a myocardial perfusion study performed 03/04/2018 I independently reviewed the echo was 54% and septal hypokinesia due to CABG.  The ST segment response was normal.  He had small fixed defect present and had no ischemia.  Carotid duplex performed 04/21/2018 showed 40 to 59% right internal carotid artery 1 to 39% left internal carotid artery.  Sadly he has lost his wife however he is remained very active and has had no angina dyspnea  palpitation or syncope.  His laboratory studies February are exceptional lipids are at target with an LDL of 23 HDL of 29 cholesterol 70 triglycerides of 88 A1c 7.1% creatinine mildly elevated 1.2.  Normal CBC and CMP otherwise normal.  EKG today in my office shows sinus rhythm otherwise normal.  He has had no TIA. Past Medical History:  Diagnosis Date  . Abdominal pain 01/27/2016  . Abnormal carotid ultrasound 04/23/2018  . Abnormal LFTs   . Arthritis   . ASCVD (arteriosclerotic cardiovascular disease)   . CAD (coronary artery disease)    January, 2012, nuclear, EF 61%, normal  . Carotid artery disease ([place])    Doppler, August, 2011, [id number] RIC A., [id number] LICA, followup one year  . Carpal tunnel syndrome   . Cholecystitis 11/13/2016  . Chronic gout without tophus 06/19/2016  . Coronary artery disease involving native coronary artery of native heart 06/28/2015   January, 2012, nuclear, EF 61%, normal  . Cough due to ACE inhibitor 12/16/2014   The patient's case was changed to an ARB in March, 2016.   . DDD (degenerative disc disease), cervical 06/19/2016  . DDD (degenerative disc disease), lumbar 06/19/2016   Spinal Fusion  . Diabetes mellitus   . Dyslipidemia 05/28/2013   Joint pains that may have been related to Crestor.   . Ejection fraction   . Essential hypertension 06/28/2015  . Gout   . High risk medications (not anticoagulants) long-term use 06/19/2016  . HTN (hypertension), benign   .  Hx of CABG    2005  . Hyperlipidemia   . Kidney stones 06/19/2016  . Old MI (myocardial infarction) 02/12/2018  . Osteoporosis 06/19/2016  . Pain 02/2010   right neck, right shoulder, right wrist, right leg-not cardiac  . Pancreatitis 01/27/2016  . Pancreatitis, acute   . 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 Psoriasis 06/19/2016  . Psoriatic arthritis ([place])    2012  . Renal insufficiency     Past Surgical History:  Procedure Laterality Date  . CARDIAC CATHETERIZATION    . CHOLECYSTECTOMY    .  CORONARY ARTERY BYPASS GRAFT  2005  . CORONARY ARTERY BYPASS GRAFT    . SPINAL FUSION      Current Medications: Current Meds  Medication Sig  . aspirin 81 MG tablet Take 81 mg by mouth every morning.   . carvedilol (COREG) 25 MG tablet Take 25 mg by mouth 2 (two) times daily with a meal.   . Cholecalciferol (VITAMIN D3) 5000 UNITS CAPS Take 5,000 Units by mouth every morning.   . docusate sodium (COLACE) 100 MG capsule Take 100 mg by mouth daily as needed for mild constipation.   . finasteride (PROSCAR) 5 MG tablet Take 5 mg by mouth every morning.   . folic acid (FOLVITE) 1 MG tablet Take 1 tablet (1 mg total) by mouth every evening.  Marland Kitchen lisinopril (PRINIVIL,ZESTRIL) 20 MG tablet Take 20 mg by mouth every evening.  . metFORMIN (GLUCOPHAGE) 1000 MG tablet Take 1,000 mg by mouth daily with breakfast.   . methotrexate (RHEUMATREX) 2.5 MG tablet TAKE 4 TABLETS  BY MOUTH ONCE A WEEK. (CAUTION CHEMOTHERAPY, PROTECT FROM LIGHT)  . simvastatin (ZOCOR) 40 MG tablet Take 40 mg by mouth at bedtime.     Allergies:   Sulfa antibiotics   Social History   Socioeconomic History  . Marital status: Married    Spouse name: Not on file  . Number of children: Not on file  . Years of education: Not on file  . Highest education level: Not on file  Occupational History  . Not on file  Tobacco Use  . Smoking status: Former Smoker    Packs/day: 0.50    Years: 10.00    Pack years: 5.00    Types: Cigarettes    Quit date: 03/21/1965    Years since quitting: 54.8  . Smokeless tobacco: Never Used  Substance and Sexual Activity  . Alcohol use: No    Alcohol/week: 0.0 standard drinks  . Drug use: Never  . Sexual activity: Not on file  Other Topics Concern  . Not on file  Social History Narrative   Level of education: High school    Employment: retired, [employer] maintenance       Social Determinants of Radio broadcast assistant Strain:   . Difficulty of Paying Living Expenses:   Food Insecurity:     . Worried About Charity fundraiser in the Last Year:   . Arboriculturist in the Last Year:   Transportation Needs:   . Film/video editor (Medical):   Marland Kitchen Lack of Transportation (Non-Medical):   Physical Activity:   . Days of Exercise per Week:   . Minutes of Exercise per Session:   Stress:   . Feeling of Stress :   Social Connections:   . Frequency of Communication with Friends and Family:   . Frequency of Social Gatherings with Friends and Family:   . Attends Religious Services:   . Active Member  of Clubs or Organizations:   . Attends Archivist Meetings:   Marland Kitchen Marital Status:      Family History: The patient's family history includes CVA in his mother; Heart attack in his father; Heart disease in his brother; Stroke in his mother. ROS:   Please see the history of present illness.    All other systems reviewed and are negative.  EKGs/Labs/Other Studies Reviewed:    The following studies were reviewed today:  EKG:  EKG ordered today and personally reviewed.  The ekg ordered today demonstrates sinus rhythm normal   Recent Lipid Panel    Component Value Date/Time   CHOL 93 01/28/2016 0453   TRIG 67 01/28/2016 0453   HDL 33 (L) 01/28/2016 0453   CHOLHDL 2.8 01/28/2016 0453   VLDL 13 01/28/2016 0453   LDLCALC 47 01/28/2016 0453    Physical Exam:    VS:  BP (!) 170/82   Pulse 66   Ht 5\' 10"  (1.778 m)   Wt 171 lb 3.2 oz (77.7 kg)   SpO2 98%   BMI 24.56 kg/m     Wt Readings from Last 3 Encounters:  01/13/20 171 lb 3.2 oz (77.7 kg)  03/04/18 188 lb (85.3 kg)  02/27/18 188 lb (85.3 kg)     GEN:  Well nourished, well developed in no acute distress HEENT: Normal NECK: No JVD; No carotid bruits LYMPHATICS: No lymphadenopathy CARDIAC: RRR, no murmurs, rubs, gallops RESPIRATORY:  Clear to auscultation without rales, wheezing or rhonchi  ABDOMEN: Soft, non-tender, non-distended MUSCULOSKELETAL:  No edema; No deformity  SKIN: Warm and dry NEUROLOGIC:   Alert and oriented x 3 PSYCHIATRIC:  Normal affect    Signed, Dan More, MD  01/13/2020 8:19 AM    [place]

## 2020-01-13 ENCOUNTER — Encounter: Payer: Self-pay | Admitting: Cardiology

## 2020-01-13 ENCOUNTER — Other Ambulatory Visit: Payer: Self-pay

## 2020-01-13 ENCOUNTER — Ambulatory Visit: Payer: Medicare HMO | Admitting: Cardiology

## 2020-01-13 VITALS — BP 170/82 | HR 66 | Ht 70.0 in | Wt 171.2 lb

## 2020-01-13 DIAGNOSIS — I10 Essential (primary) hypertension: Secondary | ICD-10-CM | POA: Diagnosis not present

## 2020-01-13 DIAGNOSIS — I6523 Occlusion and stenosis of bilateral carotid arteries: Secondary | ICD-10-CM

## 2020-01-13 DIAGNOSIS — Z951 Presence of aortocoronary bypass graft: Secondary | ICD-10-CM

## 2020-01-13 DIAGNOSIS — E785 Hyperlipidemia, unspecified: Secondary | ICD-10-CM

## 2020-01-13 DIAGNOSIS — I25118 Atherosclerotic heart disease of native coronary artery with other forms of angina pectoris: Secondary | ICD-10-CM

## 2020-01-13 NOTE — Patient Instructions (Signed)
Medication Instructions:  Your physician recommends that you continue on your current medications as directed. Please refer to the Current Medication list given to you today.  *If you need a refill on your cardiac medications before your next appointment, please call your pharmacy*   Lab Work: None If you have labs (blood work) drawn today and your tests are completely normal, you will receive your results only by: MyChart Message (if you have MyChart) OR A paper copy in the mail If you have any lab test that is abnormal or we need to change your treatment, we will call you to review the results.   Testing/Procedures: Your physician has requested that you have a carotid duplex. This test is an ultrasound of the carotid arteries in your neck. It looks at blood flow through these arteries that supply the brain with blood. Allow one hour for this exam. There are no restrictions or special instructions.    Follow-Up: At [facility], you and your health needs are our priority.  As part of our continuing mission to provide you with exceptional heart care, we have created designated Provider Care Teams.  These Care Teams include your primary Cardiologist (physician) and Advanced Practice Providers (APPs -  Physician Assistants and Nurse Practitioners) who all work together to provide you with the care you need, when you need it.  We recommend signing up for the patient portal called "MyChart".  Sign up information is provided on this After Visit Summary.  MyChart is used to connect with patients for Virtual Visits (Telemedicine).  Patients are able to view lab/test results, encounter notes, upcoming appointments, etc.  Non-urgent messages can be sent to your provider as well.   To learn more about what you can do with MyChart, go to [email or web address].    Your next appointment:   1 year(s)  The format for your next appointment:   In Person  Provider:   Brian Munley, MD   Other  Instructions   

## 2020-01-31 ENCOUNTER — Ambulatory Visit (INDEPENDENT_AMBULATORY_CARE_PROVIDER_SITE_OTHER): Payer: Medicare HMO

## 2020-01-31 ENCOUNTER — Telehealth: Payer: Self-pay

## 2020-01-31 ENCOUNTER — Other Ambulatory Visit: Payer: Self-pay

## 2020-01-31 DIAGNOSIS — I6523 Occlusion and stenosis of bilateral carotid arteries: Secondary | ICD-10-CM | POA: Diagnosis not present

## 2020-01-31 NOTE — Telephone Encounter (Signed)
Spoke with patient regarding results and recommendation.  Patient verbalizes understanding and is agreeable to plan of care. Advised patient to call back with any issues or concerns.  

## 2020-01-31 NOTE — Progress Notes (Signed)
Carotid duplex exam performed  Jimmy Nehan Flaum RDCS, RVT 

## 2020-02-16 DIAGNOSIS — E1151 Type 2 diabetes mellitus with diabetic peripheral angiopathy without gangrene: Secondary | ICD-10-CM | POA: Diagnosis not present

## 2020-02-16 DIAGNOSIS — E559 Vitamin D deficiency, unspecified: Secondary | ICD-10-CM | POA: Diagnosis not present

## 2020-03-22 DIAGNOSIS — E559 Vitamin D deficiency, unspecified: Secondary | ICD-10-CM | POA: Diagnosis not present

## 2020-03-22 DIAGNOSIS — E782 Mixed hyperlipidemia: Secondary | ICD-10-CM | POA: Diagnosis not present

## 2020-03-22 DIAGNOSIS — L4050 Arthropathic psoriasis, unspecified: Secondary | ICD-10-CM | POA: Diagnosis not present

## 2020-03-22 DIAGNOSIS — E1151 Type 2 diabetes mellitus with diabetic peripheral angiopathy without gangrene: Secondary | ICD-10-CM | POA: Diagnosis not present

## 2020-03-22 DIAGNOSIS — M109 Gout, unspecified: Secondary | ICD-10-CM | POA: Diagnosis not present

## 2020-03-22 DIAGNOSIS — Z79899 Other long term (current) drug therapy: Secondary | ICD-10-CM | POA: Diagnosis not present

## 2020-03-28 DIAGNOSIS — I2510 Atherosclerotic heart disease of native coronary artery without angina pectoris: Secondary | ICD-10-CM | POA: Diagnosis not present

## 2020-03-28 DIAGNOSIS — I739 Peripheral vascular disease, unspecified: Secondary | ICD-10-CM | POA: Diagnosis not present

## 2020-03-28 DIAGNOSIS — E782 Mixed hyperlipidemia: Secondary | ICD-10-CM | POA: Diagnosis not present

## 2020-03-28 DIAGNOSIS — N1831 Chronic kidney disease, stage 3a: Secondary | ICD-10-CM | POA: Diagnosis not present

## 2020-03-28 DIAGNOSIS — N401 Enlarged prostate with lower urinary tract symptoms: Secondary | ICD-10-CM | POA: Diagnosis not present

## 2020-03-28 DIAGNOSIS — I10 Essential (primary) hypertension: Secondary | ICD-10-CM | POA: Diagnosis not present

## 2020-03-28 DIAGNOSIS — M109 Gout, unspecified: Secondary | ICD-10-CM | POA: Diagnosis not present

## 2020-03-28 DIAGNOSIS — E1151 Type 2 diabetes mellitus with diabetic peripheral angiopathy without gangrene: Secondary | ICD-10-CM | POA: Diagnosis not present

## 2020-03-28 DIAGNOSIS — L4050 Arthropathic psoriasis, unspecified: Secondary | ICD-10-CM | POA: Diagnosis not present

## 2020-05-08 DIAGNOSIS — Z981 Arthrodesis status: Secondary | ICD-10-CM | POA: Diagnosis not present

## 2020-05-08 DIAGNOSIS — M5136 Other intervertebral disc degeneration, lumbar region: Secondary | ICD-10-CM | POA: Diagnosis not present

## 2020-05-08 DIAGNOSIS — M545 Low back pain: Secondary | ICD-10-CM | POA: Diagnosis not present

## 2020-05-10 DIAGNOSIS — M546 Pain in thoracic spine: Secondary | ICD-10-CM | POA: Diagnosis not present

## 2020-05-10 DIAGNOSIS — M5489 Other dorsalgia: Secondary | ICD-10-CM | POA: Diagnosis not present

## 2020-06-06 DIAGNOSIS — Z23 Encounter for immunization: Secondary | ICD-10-CM | POA: Diagnosis not present

## 2020-07-24 DIAGNOSIS — H2703 Aphakia, bilateral: Secondary | ICD-10-CM | POA: Diagnosis not present

## 2020-07-24 DIAGNOSIS — E119 Type 2 diabetes mellitus without complications: Secondary | ICD-10-CM | POA: Diagnosis not present

## 2020-11-01 DIAGNOSIS — J22 Unspecified acute lower respiratory infection: Secondary | ICD-10-CM | POA: Diagnosis not present

## 2020-11-01 DIAGNOSIS — Z20822 Contact with and (suspected) exposure to covid-19: Secondary | ICD-10-CM | POA: Diagnosis not present

## 2020-11-09 DIAGNOSIS — E559 Vitamin D deficiency, unspecified: Secondary | ICD-10-CM | POA: Diagnosis not present

## 2020-11-09 DIAGNOSIS — E1151 Type 2 diabetes mellitus with diabetic peripheral angiopathy without gangrene: Secondary | ICD-10-CM | POA: Diagnosis not present

## 2020-11-09 DIAGNOSIS — Z79899 Other long term (current) drug therapy: Secondary | ICD-10-CM | POA: Diagnosis not present

## 2020-11-09 DIAGNOSIS — L4050 Arthropathic psoriasis, unspecified: Secondary | ICD-10-CM | POA: Diagnosis not present

## 2020-11-09 DIAGNOSIS — E782 Mixed hyperlipidemia: Secondary | ICD-10-CM | POA: Diagnosis not present

## 2020-11-09 DIAGNOSIS — M109 Gout, unspecified: Secondary | ICD-10-CM | POA: Diagnosis not present

## 2020-11-15 DIAGNOSIS — I25119 Atherosclerotic heart disease of native coronary artery with unspecified angina pectoris: Secondary | ICD-10-CM | POA: Diagnosis not present

## 2020-11-15 DIAGNOSIS — E1151 Type 2 diabetes mellitus with diabetic peripheral angiopathy without gangrene: Secondary | ICD-10-CM | POA: Diagnosis not present

## 2020-11-15 DIAGNOSIS — Z0000 Encounter for general adult medical examination without abnormal findings: Secondary | ICD-10-CM | POA: Diagnosis not present

## 2020-11-15 DIAGNOSIS — I10 Essential (primary) hypertension: Secondary | ICD-10-CM | POA: Diagnosis not present

## 2020-11-15 DIAGNOSIS — I70213 Atherosclerosis of native arteries of extremities with intermittent claudication, bilateral legs: Secondary | ICD-10-CM | POA: Diagnosis not present

## 2020-11-15 DIAGNOSIS — E782 Mixed hyperlipidemia: Secondary | ICD-10-CM | POA: Diagnosis not present

## 2020-11-15 DIAGNOSIS — L4050 Arthropathic psoriasis, unspecified: Secondary | ICD-10-CM | POA: Diagnosis not present

## 2020-11-15 DIAGNOSIS — N1831 Chronic kidney disease, stage 3a: Secondary | ICD-10-CM | POA: Diagnosis not present

## 2020-11-15 DIAGNOSIS — M1A09X0 Idiopathic chronic gout, multiple sites, without tophus (tophi): Secondary | ICD-10-CM | POA: Diagnosis not present

## 2020-11-24 DIAGNOSIS — L821 Other seborrheic keratosis: Secondary | ICD-10-CM | POA: Diagnosis not present

## 2020-11-24 DIAGNOSIS — L578 Other skin changes due to chronic exposure to nonionizing radiation: Secondary | ICD-10-CM | POA: Diagnosis not present

## 2020-11-24 DIAGNOSIS — L570 Actinic keratosis: Secondary | ICD-10-CM | POA: Diagnosis not present

## 2020-11-29 DIAGNOSIS — G5600 Carpal tunnel syndrome, unspecified upper limb: Secondary | ICD-10-CM | POA: Insufficient documentation

## 2020-11-29 DIAGNOSIS — M1990 Unspecified osteoarthritis, unspecified site: Secondary | ICD-10-CM | POA: Insufficient documentation

## 2020-11-29 DIAGNOSIS — I10 Essential (primary) hypertension: Secondary | ICD-10-CM | POA: Insufficient documentation

## 2020-11-29 DIAGNOSIS — M109 Gout, unspecified: Secondary | ICD-10-CM | POA: Insufficient documentation

## 2020-11-29 DIAGNOSIS — I2510 Atherosclerotic heart disease of native coronary artery without angina pectoris: Secondary | ICD-10-CM | POA: Insufficient documentation

## 2020-12-16 DIAGNOSIS — E782 Mixed hyperlipidemia: Secondary | ICD-10-CM | POA: Diagnosis not present

## 2020-12-16 DIAGNOSIS — E1151 Type 2 diabetes mellitus with diabetic peripheral angiopathy without gangrene: Secondary | ICD-10-CM | POA: Diagnosis not present

## 2020-12-16 DIAGNOSIS — I10 Essential (primary) hypertension: Secondary | ICD-10-CM | POA: Diagnosis not present

## 2020-12-25 NOTE — Progress Notes (Signed)
Cardiology Office Note:    Date:  12/26/2020   ID:  Dan Holmes, DOB 05-31-1932, MRN [id number]  PCP:  Street, Sharon Mt, MD  Cardiologist:  Shirlee More, MD    Referring MD: [address], Sharon Mt, *    ASSESSMENT:    1. Coronary artery disease of native artery of native heart with stable angina pectoris ([place])   2. Hx of CABG   3. Essential hypertension   4. Dyslipidemia   5. Bilateral carotid artery stenosis    PLAN:    In order of problems listed above:  1. He continues to do well remains asymptomatic more than 15 years from bypass surgery [place] Heart Association class I continue medical therapy including aspirin beta-blocker lipid-lowering with his intermediate intensity statin and antihypertensives.  I do not think he requires an ischemia evaluation at this time. 2. BP at target continue current treatment ACE inhibitor beta-blocker normal renal function and potassium 3. Stable lipids are at target ideal continue with statin 4. Asymptomatic from carotid disease no bruit plan to recheck a duplex in 1 to 2 years   Next appointment: 1 year   Medication Adjustments/Labs and Tests Ordered: Current medicines are reviewed at length with the patient today.  Concerns regarding medicines are outlined above.  No orders of the defined types were placed in this encounter.  No orders of the defined types were placed in this encounter.   Chief Complaint  Patient presents with  . Annual Exam    History of Present Illness:    Dan Holmes is a 87 y.o. male with a hx of coronary artery disease with remote CABG in 2005 hypertension dyslipidemia and carotid stenosis last seen 01/12/2021.  The perfusion study performed 03/04/2018 low normal ejection fraction for male 54% with septal hypokinesia there is no ischemia noted and fixed defect was present in the inferior and apical segment.  Compliance with diet, lifestyle and medications: Yes  Is seen today in yearly  follow-up.  Remains very spry vigorous active exercises attentive to his diet and has had no exercise intolerance shortness of breath chest pain palpitation or syncope.  He has mild carotid disease and has had no TIA.  He had typical angina before bypass and has had no recurrence.  He is pleased with the quality of his life and in particular he has done much better with methotrexate and is not having joint symptoms.  He had a carotid duplex performed 01/30/2021 mild right internal carotid stenosis 40 to 59%, mild left internal carotid artery stenosis 40 to 59%.  Recent labs from his PCP office 11/09/2020: A1c 6.4% at target CRP low at 1 Hemoglobin mildly diminished 11.6 normal MCV CMP with a creatinine 1.2 GFR greater than 60 cc normal liver function test Cholesterol 71 LDL 28 HDL 28 triglycerides 74 T4 TSH normal Past Medical History:  Diagnosis Date  . Abdominal pain 01/27/2016  . Abnormal carotid ultrasound 04/23/2018  . Abnormal LFTs   . Arthritis   . ASCVD (arteriosclerotic cardiovascular disease)   . CAD (coronary artery disease)    January, 2012, nuclear, EF 61%, normal  . Carotid artery disease ([place])    Doppler, August, 2011, 55-73% RIC A., 2-20% LICA, followup one year  . Carpal tunnel syndrome   . Cholecystitis 11/13/2016  . Chronic gout without tophus 06/19/2016  . Coronary artery disease involving native coronary artery of native heart 06/28/2015   January, 2012, nuclear, EF 61%, normal  . Cough due to ACE  inhibitor 12/16/2014   The patient's case was changed to an ARB in March, 2016.   . DDD (degenerative disc disease), cervical 06/19/2016  . DDD (degenerative disc disease), lumbar 06/19/2016   Spinal Fusion  . Diabetes mellitus   . Dyslipidemia 05/28/2013   Joint pains that may have been related to Crestor.   . Ejection fraction   . Essential hypertension 06/28/2015  . Gout   . High risk medications (not anticoagulants) long-term use 06/19/2016  . HTN (hypertension), benign    . Hx of CABG    2005  . Hyperlipidemia   . Kidney stones 06/19/2016  . Old MI (myocardial infarction) 02/12/2018  . Osteoporosis 06/19/2016  . Pain 02/2010   right neck, right shoulder, right wrist, right leg-not cardiac  . Pancreatitis 01/27/2016  . Pancreatitis, acute   . 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 Psoriasis 06/19/2016  . Psoriatic arthritis ([place])    2012  . Renal insufficiency     Past Surgical History:  Procedure Laterality Date  . CARDIAC CATHETERIZATION    . CHOLECYSTECTOMY    . CORONARY ARTERY BYPASS GRAFT  2005  . CORONARY ARTERY BYPASS GRAFT    . SPINAL FUSION      Current Medications: Current Meds  Medication Sig  . allopurinol (ZYLOPRIM) 300 MG tablet Take 1 tablet (300 mg total) by mouth daily.  Marland Kitchen aspirin 81 MG tablet Take 81 mg by mouth every morning.   . carvedilol (COREG) 25 MG tablet Take 25 mg by mouth 2 (two) times daily with a meal.  . Cholecalciferol (VITAMIN D3) 5000 UNITS CAPS Take 5,000 Units by mouth every morning.  . finasteride (PROSCAR) 5 MG tablet Take 5 mg by mouth every morning.   . folic acid (FOLVITE) 1 MG tablet Take 1 tablet (1 mg total) by mouth every evening.  Marland Kitchen lisinopril (PRINIVIL,ZESTRIL) 20 MG tablet Take 20 mg by mouth every evening.  . metFORMIN (GLUCOPHAGE) 1000 MG tablet Take 1,000 mg by mouth daily with breakfast.   . methotrexate (RHEUMATREX) 2.5 MG tablet Take 10 mg by mouth once a week. Caution:Chemotherapy. Protect from light. Take 4 once a week  . Multiple Vitamins-Minerals (PRESERVISION AREDS 2 PO) Take 1 tablet by mouth 2 (two) times daily.  . simvastatin (ZOCOR) 40 MG tablet Take 40 mg by mouth at bedtime.     Allergies:   Sulfa antibiotics   Social History   Socioeconomic History  . Marital status: Married    Spouse name: Not on file  . Number of children: Not on file  . Years of education: Not on file  . Highest education level: Not on file  Occupational History  . Not on file  Tobacco Use  .  Smoking status: Former Smoker    Packs/day: 0.50    Years: 10.00    Pack years: 5.00    Types: Cigarettes    Quit date: 03/21/1965    Years since quitting: 55.8  . Smokeless tobacco: Never Used  Vaping Use  . Vaping Use: Never used  Substance and Sexual Activity  . Alcohol use: No    Alcohol/week: 0.0 standard drinks  . Drug use: Never  . Sexual activity: Not on file  Other Topics Concern  . Not on file  Social History Narrative   Level of education: High school    Employment: retired, [employer] maintenance       Social Determinants of Radio broadcast assistant Strain: Not on [employer] Insecurity: Not on file  Transportation Needs: Not on file  Physical Activity: Not on file  Stress: Not on file  Social Connections: Not on file     Family History: The patient's family history includes CVA in his mother; Heart attack in his father; Heart disease in his brother; Stroke in his mother. ROS:   Please see the history of present illness.    All other systems reviewed and are negative.  EKGs/Labs/Other Studies Reviewed:    The following studies were reviewed today:  EKG:  EKG ordered today and personally reviewed.  The ekg ordered today demonstrates sinus rhythm and remains normal    Physical Exam:    VS:  BP 130/80 (BP Location: Right Arm, Patient Position: Sitting, Cuff Size: Normal)   Pulse 66   Ht 5\' 10"  (1.778 m)   Wt 174 lb (78.9 kg)   SpO2 96%   BMI 24.97 kg/m     Wt Readings from Last 3 Encounters:  12/26/20 174 lb (78.9 kg)  01/13/20 171 lb 3.2 oz (77.7 kg)  03/04/18 188 lb (85.3 kg)     GEN:  Well nourished, well developed in no acute distress HEENT: Normal NECK: No JVD; No carotid bruits LYMPHATICS: No lymphadenopathy CARDIAC: RRR, no murmurs, rubs, gallops RESPIRATORY:  Clear to auscultation without rales, wheezing or rhonchi  ABDOMEN: Soft, non-tender, non-distended MUSCULOSKELETAL:  No edema; No deformity  SKIN: Warm and dry NEUROLOGIC:  Alert  and oriented x 3 PSYCHIATRIC:  Normal affect    Signed, Shirlee More, MD  12/26/2020 9:49 AM    [place]

## 2020-12-26 ENCOUNTER — Ambulatory Visit: Payer: Medicare HMO | Admitting: Cardiology

## 2020-12-26 ENCOUNTER — Encounter: Payer: Self-pay | Admitting: Cardiology

## 2020-12-26 ENCOUNTER — Other Ambulatory Visit: Payer: Self-pay

## 2020-12-26 VITALS — BP 130/80 | HR 66 | Ht 70.0 in | Wt 174.0 lb

## 2020-12-26 DIAGNOSIS — E785 Hyperlipidemia, unspecified: Secondary | ICD-10-CM

## 2020-12-26 DIAGNOSIS — I25118 Atherosclerotic heart disease of native coronary artery with other forms of angina pectoris: Secondary | ICD-10-CM

## 2020-12-26 DIAGNOSIS — I6523 Occlusion and stenosis of bilateral carotid arteries: Secondary | ICD-10-CM | POA: Diagnosis not present

## 2020-12-26 DIAGNOSIS — I10 Essential (primary) hypertension: Secondary | ICD-10-CM

## 2020-12-26 DIAGNOSIS — Z951 Presence of aortocoronary bypass graft: Secondary | ICD-10-CM | POA: Diagnosis not present

## 2020-12-26 NOTE — Patient Instructions (Signed)

## 2021-01-15 DIAGNOSIS — E1151 Type 2 diabetes mellitus with diabetic peripheral angiopathy without gangrene: Secondary | ICD-10-CM | POA: Diagnosis not present

## 2021-01-15 DIAGNOSIS — E559 Vitamin D deficiency, unspecified: Secondary | ICD-10-CM | POA: Diagnosis not present

## 2021-01-19 DIAGNOSIS — D485 Neoplasm of uncertain behavior of skin: Secondary | ICD-10-CM | POA: Diagnosis not present

## 2021-01-19 DIAGNOSIS — L578 Other skin changes due to chronic exposure to nonionizing radiation: Secondary | ICD-10-CM | POA: Diagnosis not present

## 2021-01-19 DIAGNOSIS — L821 Other seborrheic keratosis: Secondary | ICD-10-CM | POA: Diagnosis not present

## 2021-01-19 DIAGNOSIS — L570 Actinic keratosis: Secondary | ICD-10-CM | POA: Diagnosis not present

## 2021-02-15 DIAGNOSIS — E559 Vitamin D deficiency, unspecified: Secondary | ICD-10-CM | POA: Diagnosis not present

## 2021-02-15 DIAGNOSIS — E1151 Type 2 diabetes mellitus with diabetic peripheral angiopathy without gangrene: Secondary | ICD-10-CM | POA: Diagnosis not present

## 2021-03-18 DIAGNOSIS — E559 Vitamin D deficiency, unspecified: Secondary | ICD-10-CM | POA: Diagnosis not present

## 2021-03-18 DIAGNOSIS — E1151 Type 2 diabetes mellitus with diabetic peripheral angiopathy without gangrene: Secondary | ICD-10-CM | POA: Diagnosis not present

## 2021-04-16 DIAGNOSIS — E559 Vitamin D deficiency, unspecified: Secondary | ICD-10-CM | POA: Diagnosis not present

## 2021-04-16 DIAGNOSIS — M1A09X0 Idiopathic chronic gout, multiple sites, without tophus (tophi): Secondary | ICD-10-CM | POA: Diagnosis not present

## 2021-04-16 DIAGNOSIS — Z79899 Other long term (current) drug therapy: Secondary | ICD-10-CM | POA: Diagnosis not present

## 2021-04-16 DIAGNOSIS — E1151 Type 2 diabetes mellitus with diabetic peripheral angiopathy without gangrene: Secondary | ICD-10-CM | POA: Diagnosis not present

## 2021-04-16 DIAGNOSIS — E782 Mixed hyperlipidemia: Secondary | ICD-10-CM | POA: Diagnosis not present

## 2021-04-16 DIAGNOSIS — L4050 Arthropathic psoriasis, unspecified: Secondary | ICD-10-CM | POA: Diagnosis not present

## 2021-04-24 DIAGNOSIS — L4050 Arthropathic psoriasis, unspecified: Secondary | ICD-10-CM | POA: Diagnosis not present

## 2021-04-24 DIAGNOSIS — I25119 Atherosclerotic heart disease of native coronary artery with unspecified angina pectoris: Secondary | ICD-10-CM | POA: Diagnosis not present

## 2021-04-24 DIAGNOSIS — E1151 Type 2 diabetes mellitus with diabetic peripheral angiopathy without gangrene: Secondary | ICD-10-CM | POA: Diagnosis not present

## 2021-04-24 DIAGNOSIS — E559 Vitamin D deficiency, unspecified: Secondary | ICD-10-CM | POA: Diagnosis not present

## 2021-04-24 DIAGNOSIS — E782 Mixed hyperlipidemia: Secondary | ICD-10-CM | POA: Diagnosis not present

## 2021-04-24 DIAGNOSIS — I70213 Atherosclerosis of native arteries of extremities with intermittent claudication, bilateral legs: Secondary | ICD-10-CM | POA: Diagnosis not present

## 2021-04-24 DIAGNOSIS — N1831 Chronic kidney disease, stage 3a: Secondary | ICD-10-CM | POA: Diagnosis not present

## 2021-04-24 DIAGNOSIS — M1A09X0 Idiopathic chronic gout, multiple sites, without tophus (tophi): Secondary | ICD-10-CM | POA: Diagnosis not present

## 2021-04-24 DIAGNOSIS — I10 Essential (primary) hypertension: Secondary | ICD-10-CM | POA: Diagnosis not present

## 2021-06-11 DIAGNOSIS — Z23 Encounter for immunization: Secondary | ICD-10-CM | POA: Diagnosis not present

## 2021-07-13 DIAGNOSIS — Z951 Presence of aortocoronary bypass graft: Secondary | ICD-10-CM | POA: Diagnosis not present

## 2021-07-13 DIAGNOSIS — I672 Cerebral atherosclerosis: Secondary | ICD-10-CM | POA: Diagnosis not present

## 2021-07-13 DIAGNOSIS — I129 Hypertensive chronic kidney disease with stage 1 through stage 4 chronic kidney disease, or unspecified chronic kidney disease: Secondary | ICD-10-CM | POA: Diagnosis not present

## 2021-07-13 DIAGNOSIS — I2510 Atherosclerotic heart disease of native coronary artery without angina pectoris: Secondary | ICD-10-CM | POA: Diagnosis not present

## 2021-07-13 DIAGNOSIS — R531 Weakness: Secondary | ICD-10-CM | POA: Diagnosis not present

## 2021-07-13 DIAGNOSIS — I10 Essential (primary) hypertension: Secondary | ICD-10-CM | POA: Diagnosis not present

## 2021-07-13 DIAGNOSIS — N3000 Acute cystitis without hematuria: Secondary | ICD-10-CM | POA: Diagnosis not present

## 2021-07-13 DIAGNOSIS — I340 Nonrheumatic mitral (valve) insufficiency: Secondary | ICD-10-CM

## 2021-07-13 DIAGNOSIS — R55 Syncope and collapse: Secondary | ICD-10-CM | POA: Diagnosis not present

## 2021-07-13 DIAGNOSIS — I472 Ventricular tachycardia, unspecified: Secondary | ICD-10-CM | POA: Diagnosis not present

## 2021-07-13 DIAGNOSIS — R4020 Unspecified coma: Secondary | ICD-10-CM | POA: Diagnosis not present

## 2021-07-13 DIAGNOSIS — E785 Hyperlipidemia, unspecified: Secondary | ICD-10-CM | POA: Diagnosis not present

## 2021-07-13 DIAGNOSIS — R42 Dizziness and giddiness: Secondary | ICD-10-CM | POA: Diagnosis not present

## 2021-07-13 DIAGNOSIS — E860 Dehydration: Secondary | ICD-10-CM | POA: Diagnosis not present

## 2021-07-13 DIAGNOSIS — I951 Orthostatic hypotension: Secondary | ICD-10-CM | POA: Diagnosis not present

## 2021-07-13 DIAGNOSIS — N189 Chronic kidney disease, unspecified: Secondary | ICD-10-CM | POA: Diagnosis not present

## 2021-07-13 DIAGNOSIS — N170 Acute kidney failure with tubular necrosis: Secondary | ICD-10-CM | POA: Diagnosis not present

## 2021-07-14 DIAGNOSIS — N400 Enlarged prostate without lower urinary tract symptoms: Secondary | ICD-10-CM | POA: Diagnosis not present

## 2021-07-14 DIAGNOSIS — E860 Dehydration: Secondary | ICD-10-CM | POA: Diagnosis not present

## 2021-07-14 DIAGNOSIS — N170 Acute kidney failure with tubular necrosis: Secondary | ICD-10-CM | POA: Diagnosis not present

## 2021-07-14 DIAGNOSIS — I2510 Atherosclerotic heart disease of native coronary artery without angina pectoris: Secondary | ICD-10-CM

## 2021-07-14 DIAGNOSIS — I340 Nonrheumatic mitral (valve) insufficiency: Secondary | ICD-10-CM | POA: Diagnosis not present

## 2021-07-14 DIAGNOSIS — R739 Hyperglycemia, unspecified: Secondary | ICD-10-CM | POA: Diagnosis not present

## 2021-07-14 DIAGNOSIS — R55 Syncope and collapse: Secondary | ICD-10-CM

## 2021-07-14 DIAGNOSIS — I951 Orthostatic hypotension: Secondary | ICD-10-CM | POA: Diagnosis not present

## 2021-07-14 DIAGNOSIS — N3000 Acute cystitis without hematuria: Secondary | ICD-10-CM | POA: Diagnosis not present

## 2021-07-14 DIAGNOSIS — N189 Chronic kidney disease, unspecified: Secondary | ICD-10-CM | POA: Diagnosis not present

## 2021-07-14 DIAGNOSIS — I472 Ventricular tachycardia, unspecified: Secondary | ICD-10-CM | POA: Diagnosis not present

## 2021-07-14 DIAGNOSIS — E785 Hyperlipidemia, unspecified: Secondary | ICD-10-CM

## 2021-07-14 DIAGNOSIS — I672 Cerebral atherosclerosis: Secondary | ICD-10-CM | POA: Diagnosis not present

## 2021-07-14 DIAGNOSIS — I10 Essential (primary) hypertension: Secondary | ICD-10-CM

## 2021-07-14 DIAGNOSIS — D649 Anemia, unspecified: Secondary | ICD-10-CM | POA: Diagnosis not present

## 2021-07-14 DIAGNOSIS — I129 Hypertensive chronic kidney disease with stage 1 through stage 4 chronic kidney disease, or unspecified chronic kidney disease: Secondary | ICD-10-CM | POA: Diagnosis not present

## 2021-07-15 DIAGNOSIS — I472 Ventricular tachycardia, unspecified: Secondary | ICD-10-CM

## 2021-07-20 ENCOUNTER — Telehealth: Payer: Self-pay | Admitting: Cardiology

## 2021-07-20 NOTE — Addendum Note (Signed)
Addended by: Resa Miner I on: 07/20/2021 01:58 PM   Modules accepted: Orders

## 2021-07-20 NOTE — Telephone Encounter (Signed)
Spoke to the patient and his daughter just now and she let me know that the patient has been dozing off like he is falling asleep. He is not fully passing out but he is "falling asleep" constantly. He reports that he is dizzy but he believes this to be from his amiodarone that he was recently started on.   The patient is actively yelling in the background while I was speaking to the daughter.   He has an appointment with Dr. Bettina Gavia on Monday at 9 am.   No SOB, Chest pain/tightness, or any other symptoms reported.    122/58 sitting 90/59 standing  Heart rate was 68 bpm.   He refuses to wear his life vest. The daughter states she will do her best to get him to wear it but if not she will send it back.

## 2021-07-20 NOTE — Telephone Encounter (Signed)
Spoke to the patient just now and his daughter with his permission. Per Dr. Bettina Gavia he wanted the patient to stop his lisinopril and decrease his carvedilol to 12.5 mg bid. They will also check orthostatic blood pressure daily over the weekend for [place]. They verbalize understanding and thank me for calling them back.   We will see the patient in clinic on Monday morning.

## 2021-07-20 NOTE — Telephone Encounter (Signed)
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  He if refusing he wear his vest.  She would like speak nurse.

## 2021-07-22 NOTE — H&P (View-Only) (Signed)
Cardiology Office Note:    Date:  07/23/2021   ID:  Dan Holmes, DOB Jun 17, 1932, MRN [id number]  PCP:  Street, Sharon Mt, MD  Cardiologist:  Shirlee More, MD    Referring MD: [address], Sharon Mt, *    ASSESSMENT:    1. Ventricular tachycardia   2. On amiodarone therapy   3. Coronary artery disease of native artery of native heart with stable angina pectoris ([place])   4. Hx of CABG   5. Essential hypertension   6. Dyslipidemia    PLAN:    In order of problems listed above:  Very complex case of sustained VT syncope preceding hospitalization at [facility] treated with loaded with amiodarone and now seen for follow-up.  He refuses to use a LifeVest.  I discussed with him undergoing coronary angiography and he wants to proceed I reviewed the risk benefits and options with him and his daughter and they agree.  I told him in my opinion I think he be very high risk for redo bypass surgery and I am also can ask him to see EP in consultation. Think his dizziness is much more related to blood pressure after discharge I am a little bit unsure why this happened he was on the same medications he had before hospitalization during the hospital but his daughter says that he is very compliant with his medicines at home organ to leave him off ACE inhibitor continue reduced dose beta-blocker. Continue his statin   Next appointment: 1 month   Medication Adjustments/Labs and Tests Ordered: Current medicines are reviewed at length with the patient today.  Concerns regarding medicines are outlined above.  Orders Placed This Encounter  Procedures   CBC   Basic metabolic panel   Ambulatory referral to Cardiac Electrophysiology   EKG 12-Lead    No orders of the defined types were placed in this encounter.   Chief Complaint  Patient presents with   Follow-up    Recent hospitalization with syncope he had sustained ventricular tachycardia hemodynamically tolerated broke with IV  amiodarone was loaded through the hospitalization discharged with a LifeVest.    .[facility]  History of Present Illness:    Dan Holmes is a 87 y.o. male with a hx of coronary artery disease with remote CABG in 2005 hypertension dyslipidemia and carotid stenosis last seen in the office 050 05/2021.  He had a carotid duplex performed 01/30/2021 mild right internal carotid stenosis 40 to 59%, mild left internal carotid artery stenosis 40 to 59%.  He has bypass surgery at [facility].  The operative note and discharge summary 02/22/2004 revealed that he had bypass surgery at the left thoracic artery anastomosed to the left anterior descending vein graft sequentially to the first and second marginal branches and a vein graft sequentially to the right coronary artery Posterior descending coronary artery.    Compliance with diet, lifestyle and medications: Yes  His daughter is present after discharge she was having lightheadedness and another episode while he was in the restaurant but did not lose consciousness Unfortunately he has refused to wear a LifeVest and did not have an ARB at the time He says he feels dizzy we had stopped his ACE inhibitor blood pressure in the 90s and reduce the dose of his beta-blocker. He is not having angina shortness of breath or palpitation. While in hospital I discussed the case with EP in regard to go ahead and plan for coronary angiography to look for ischemic substrate. He is not  driving a car and understands he cannot for 6 months His daughter is going arrange for an apple watch with a heart rate alert of 130 bpm and to capture episodes if he has recurrent dizziness   He was admitted to [place] 2 weeks ago with a witnessed syncopal episode while in hospital had sustained monomorphic VT rate of 150-160 bpm tolerated without hypotension.  This terminated with IV magnesium loading with IV amiodarone.  I reviewed the case with my EP colleague  the decision was made after discussing with the patient loading with amiodarone as an inpatient he had no further ventricular arrhythmia.  He was discharged to home care I asked him to use a LifeVest for the first month we got a call from his daughter that he refuses and she was going to return the device.  She also described him as weak.Marland Kitchen  His echocardiogram showed normal left ventricular ejection fraction and segmental hypokinesia basilar inferior inferolateral wall at [facility].  He had no evidence of acute coronary syndrome with his history remote CABG we will going to consider outpatient referral for diagnostic coronary angiography.  His goals during the hospitalization would return home as quickly as possible continue to live in an independent life.  I reviewed place a coronary angiogram report from June 2005 to the chart for completeness. RESULTS:  PRESSURES:  Aortic pressure 148/67, mean of 101.  Left ventricular  pressure was 148/22.    CORONARY ARTERIES:  The left main coronary artery was free of significant disease.    Left anterior descending:  The left anterior descending artery gave rise to  four diagonal branches and three septal perforators.  There was a long area  of segmental narrowing estimated at 80% in the proximal LAD.  There is  moderate calcification.    The circumflex artery gave rise to a ramus branch and marginal branch in the  posterior lateral branch. There were 80 and 95% stenoses in the mid  circumflex artery after the marginal branch.  There was 50% narrowing in the  marginal branch.    The right coronary artery is a dominant vessel.  It gave rise to a posterior  descending and three posterior lateral branches.  There was 90% ostial  stenosis.  There was what appeared to be a ruptured plaque in the mid right  coronary artery which was only about 50% narrowed.  The posterior descending  branch had 90% and 80% stenoses.    The left ventriculogram was  performed in the RAO projection and showed  hypokinesis of the mid inferior wall.  The overall wall motion was good with  an estimated ejection fraction of 60%.    A distal aortogram was performed.  It showed patent renal arteries and no  major aortoiliac obstruction.    CONCLUSION:  Severe three vessel coronary artery disease with 30% narrowing  in the proximal left anterior descending artery, 95% narrowing in the mid  circumflex artery with 50% narrowing in the first marginal branch, 90%  ostial stenosis and 50% mid stenosis in the right coronary artery with 90  and 80% stenosis in the posterior descending branch and inferior wall  hypokinesis with an estimated ejection fraction of 60%/ Past Medical History:  Diagnosis Date   Abdominal pain 01/27/2016   Abnormal carotid ultrasound 04/23/2018   Abnormal LFTs    Arthritis    ASCVD (arteriosclerotic cardiovascular disease)    CAD (coronary artery disease)    January, 2012, nuclear, EF 61%,  normal   Carotid artery disease ([place])    Doppler, August, 2011, 32-20% RIC A., 2-54% LICA, followup one year   Carpal tunnel syndrome    Cholecystitis 11/13/2016   Chronic gout without tophus 06/19/2016   Coronary artery disease involving native coronary artery of native heart 06/28/2015   January, 2012, nuclear, EF 61%, normal   Cough due to ACE inhibitor 12/16/2014   The patient's case was changed to an ARB in March, 2016.    DDD (degenerative disc disease), cervical 06/19/2016   DDD (degenerative disc disease), lumbar 06/19/2016   Spinal Fusion   Diabetes mellitus    Dyslipidemia 05/28/2013   Joint pains that may have been related to Crestor.    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   aspirin 81 MG tablet Take 81 mg by mouth every morning.    carvedilol (COREG) 25 MG tablet Take 12.5 mg by mouth 2 (two) times daily with a meal.   Cholecalciferol (VITAMIN D3) 5000 UNITS CAPS Take 5,000 Units by mouth every morning.   finasteride (PROSCAR) 5 MG tablet Take 5 mg by mouth every morning.    folic acid (FOLVITE) 1 MG tablet Take 1 tablet (1 mg total) by mouth every evening.   metFORMIN (GLUCOPHAGE) 500 MG tablet Take 500 mg by mouth daily.   methotrexate (RHEUMATREX) 2.5 MG tablet Take 10 mg by mouth once a week. Caution:Chemotherapy. Protect from light. Take 4 once a week   Multiple Vitamins-Minerals (PRESERVISION AREDS 2 PO) Take 1 tablet by mouth 2 (two) times daily.   simvastatin (ZOCOR) 20 MG tablet Take 20 mg by mouth daily.   [DISCONTINUED] Multiple Vitamins-Minerals (PRESERVISION AREDS 2+MULTI VIT) CAPS Take 1 capsule by mouth in the morning and at bedtime.     Allergies:   Sulfa antibiotics   Social History   Socioeconomic History   Marital status: Married    Spouse name: Not on file   Number of children: Not on file   Years of education: Not on file   Highest education level: Not on file  Occupational History   Not on file  Tobacco Use   Smoking status: Former    Packs/day: 0.50    Years: 10.00    Pack years: 5.00    Types: Cigarettes    Quit date: 03/21/1965    Years since quitting: 56.3   Smokeless  tobacco: Never  Vaping Use   Vaping Use: Never used  Substance and Sexual Activity   Alcohol use: No    Alcohol/week: 0.0 standard drinks  Drug use: Never   Sexual activity: Not on file  Other Topics Concern   Not on file  Social History Narrative   Level of education: High school    Employment: retired, [employer] maintenance       Social Determinants of Radio broadcast assistant Strain: Not on file  Food Insecurity: Not on file  Transportation Needs: Not on file  Physical Activity: Not on file  Stress: Not on file  Social Connections: Not on file     Family History: The patient's family history includes CVA in his mother; Heart attack in his father; Heart disease in his brother; Stroke in his mother. ROS:   Please see the history of present illness.    All other systems reviewed and are negative.  EKGs/Labs/Other Studies Reviewed:    The following studies were reviewed today:  EKG:  EKG ordered today and personally reviewed.  The ekg ordered today demonstrates sinus rhythm normal EKG  Recent Labs: No results found for requested labs within last 8760 hours.  Recent Lipid Panel    Component Value Date/Time   CHOL 93 01/28/2016 0453   TRIG 67 01/28/2016 0453   HDL 33 (L) 01/28/2016 0453   CHOLHDL 2.8 01/28/2016 0453   VLDL 13 01/28/2016 0453   LDLCALC 47 01/28/2016 0453    Physical Exam:    VS:  Ht 5\' 10"  (1.778 m)   Wt 168 lb (76.2 kg)   SpO2 97%   BMI 24.11 kg/m     Wt Readings from Last 3 Encounters:  07/23/21 168 lb (76.2 kg)  12/26/20 174 lb (78.9 kg)  01/13/20 171 lb 3.2 oz (77.7 kg)     GEN:  Well nourished, well developed in no acute distress HEENT: Normal NECK: No JVD; No carotid bruits LYMPHATICS: No lymphadenopathy CARDIAC: RRR, no murmurs, rubs, gallops RESPIRATORY:  Clear to auscultation without rales, wheezing or rhonchi  ABDOMEN: Soft, non-tender, non-distended MUSCULOSKELETAL:  No edema; No deformity  SKIN: Warm and dry NEUROLOGIC:   Alert and oriented x 3 PSYCHIATRIC:  Normal affect    Signed, Shirlee More, MD  07/23/2021 9:39 AM    [place]

## 2021-07-22 NOTE — Progress Notes (Signed)
Cardiology Office Note:    Date:  07/23/2021   ID:  Dan Holmes, DOB 1932/05/02, MRN [id number]  PCP:  Street, Sharon Mt, MD  Cardiologist:  Shirlee More, MD    Referring MD: [address], Sharon Mt, *    ASSESSMENT:    1. Ventricular tachycardia   2. On amiodarone therapy   3. Coronary artery disease of native artery of native heart with stable angina pectoris ([place])   4. Hx of CABG   5. Essential hypertension   6. Dyslipidemia    PLAN:    In order of problems listed above:  Very complex case of sustained VT syncope preceding hospitalization at [facility] treated with loaded with amiodarone and now seen for follow-up.  He refuses to use a LifeVest.  I discussed with him undergoing coronary angiography and he wants to proceed I reviewed the risk benefits and options with him and his daughter and they agree.  I told him in my opinion I think he be very high risk for redo bypass surgery and I am also can ask him to see EP in consultation. Think his dizziness is much more related to blood pressure after discharge I am a little bit unsure why this happened he was on the same medications he had before hospitalization during the hospital but his daughter says that he is very compliant with his medicines at home organ to leave him off ACE inhibitor continue reduced dose beta-blocker. Continue his statin   Next appointment: 1 month   Medication Adjustments/Labs and Tests Ordered: Current medicines are reviewed at length with the patient today.  Concerns regarding medicines are outlined above.  Orders Placed This Encounter  Procedures   CBC   Basic metabolic panel   Ambulatory referral to Cardiac Electrophysiology   EKG 12-Lead    No orders of the defined types were placed in this encounter.   Chief Complaint  Patient presents with   Follow-up    Recent hospitalization with syncope he had sustained ventricular tachycardia hemodynamically tolerated broke with IV  amiodarone was loaded through the hospitalization discharged with a LifeVest.    .[facility]  History of Present Illness:    Dan Holmes is a 87 y.o. male with a hx of coronary artery disease with remote CABG in 2005 hypertension dyslipidemia and carotid stenosis last seen in the office 050 05/2021.  He had a carotid duplex performed 01/30/2021 mild right internal carotid stenosis 40 to 59%, mild left internal carotid artery stenosis 40 to 59%.  He has bypass surgery at [facility].  The operative note and discharge summary 02/22/2004 revealed that he had bypass surgery at the left thoracic artery anastomosed to the left anterior descending vein graft sequentially to the first and second marginal branches and a vein graft sequentially to the right coronary artery Posterior descending coronary artery.    Compliance with diet, lifestyle and medications: Yes  His daughter is present after discharge she was having lightheadedness and another episode while he was in the restaurant but did not lose consciousness Unfortunately he has refused to wear a LifeVest and did not have an ARB at the time He says he feels dizzy we had stopped his ACE inhibitor blood pressure in the 90s and reduce the dose of his beta-blocker. He is not having angina shortness of breath or palpitation. While in hospital I discussed the case with EP in regard to go ahead and plan for coronary angiography to look for ischemic substrate. He is not  driving a car and understands he cannot for 6 months His daughter is going arrange for an apple watch with a heart rate alert of 130 bpm and to capture episodes if he has recurrent dizziness   He was admitted to [place] 2 weeks ago with a witnessed syncopal episode while in hospital had sustained monomorphic VT rate of 150-160 bpm tolerated without hypotension.  This terminated with IV magnesium loading with IV amiodarone.  I reviewed the case with my EP colleague  the decision was made after discussing with the patient loading with amiodarone as an inpatient he had no further ventricular arrhythmia.  He was discharged to home care I asked him to use a LifeVest for the first month we got a call from his daughter that he refuses and she was going to return the device.  She also described him as weak.Marland Kitchen  His echocardiogram showed normal left ventricular ejection fraction and segmental hypokinesia basilar inferior inferolateral wall at [facility].  He had no evidence of acute coronary syndrome with his history remote CABG we will going to consider outpatient referral for diagnostic coronary angiography.  His goals during the hospitalization would return home as quickly as possible continue to live in an independent life.  I reviewed place a coronary angiogram report from June 2005 to the chart for completeness. RESULTS:  PRESSURES:  Aortic pressure 148/67, mean of 101.  Left ventricular  pressure was 148/22.    CORONARY ARTERIES:  The left main coronary artery was free of significant disease.    Left anterior descending:  The left anterior descending artery gave rise to  four diagonal branches and three septal perforators.  There was a long area  of segmental narrowing estimated at 80% in the proximal LAD.  There is  moderate calcification.    The circumflex artery gave rise to a ramus branch and marginal branch in the  posterior lateral branch. There were 80 and 95% stenoses in the mid  circumflex artery after the marginal branch.  There was 50% narrowing in the  marginal branch.    The right coronary artery is a dominant vessel.  It gave rise to a posterior  descending and three posterior lateral branches.  There was 90% ostial  stenosis.  There was what appeared to be a ruptured plaque in the mid right  coronary artery which was only about 50% narrowed.  The posterior descending  branch had 90% and 80% stenoses.    The left ventriculogram was  performed in the RAO projection and showed  hypokinesis of the mid inferior wall.  The overall wall motion was good with  an estimated ejection fraction of 60%.    A distal aortogram was performed.  It showed patent renal arteries and no  major aortoiliac obstruction.    CONCLUSION:  Severe three vessel coronary artery disease with 30% narrowing  in the proximal left anterior descending artery, 95% narrowing in the mid  circumflex artery with 50% narrowing in the first marginal branch, 90%  ostial stenosis and 50% mid stenosis in the right coronary artery with 90  and 80% stenosis in the posterior descending branch and inferior wall  hypokinesis with an estimated ejection fraction of 60%/ Past Medical History:  Diagnosis Date   Abdominal pain 01/27/2016   Abnormal carotid ultrasound 04/23/2018   Abnormal LFTs    Arthritis    ASCVD (arteriosclerotic cardiovascular disease)    CAD (coronary artery disease)    January, 2012, nuclear, EF 61%,  normal   Carotid artery disease ([place])    Doppler, August, 2011, 62-37% RIC A., 6-28% LICA, followup one year   Carpal tunnel syndrome    Cholecystitis 11/13/2016   Chronic gout without tophus 06/19/2016   Coronary artery disease involving native coronary artery of native heart 06/28/2015   January, 2012, nuclear, EF 61%, normal   Cough due to ACE inhibitor 12/16/2014   The patient's case was changed to an ARB in March, 2016.    DDD (degenerative disc disease), cervical 06/19/2016   DDD (degenerative disc disease), lumbar 06/19/2016   Spinal Fusion   Diabetes mellitus    Dyslipidemia 05/28/2013   Joint pains that may have been related to Crestor.    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   aspirin 81 MG tablet Take 81 mg by mouth every morning.    carvedilol (COREG) 25 MG tablet Take 12.5 mg by mouth 2 (two) times daily with a meal.   Cholecalciferol (VITAMIN D3) 5000 UNITS CAPS Take 5,000 Units by mouth every morning.   finasteride (PROSCAR) 5 MG tablet Take 5 mg by mouth every morning.    folic acid (FOLVITE) 1 MG tablet Take 1 tablet (1 mg total) by mouth every evening.   metFORMIN (GLUCOPHAGE) 500 MG tablet Take 500 mg by mouth daily.   methotrexate (RHEUMATREX) 2.5 MG tablet Take 10 mg by mouth once a week. Caution:Chemotherapy. Protect from light. Take 4 once a week   Multiple Vitamins-Minerals (PRESERVISION AREDS 2 PO) Take 1 tablet by mouth 2 (two) times daily.   simvastatin (ZOCOR) 20 MG tablet Take 20 mg by mouth daily.   [DISCONTINUED] Multiple Vitamins-Minerals (PRESERVISION AREDS 2+MULTI VIT) CAPS Take 1 capsule by mouth in the morning and at bedtime.     Allergies:   Sulfa antibiotics   Social History   Socioeconomic History   Marital status: Married    Spouse name: Not on file   Number of children: Not on file   Years of education: Not on file   Highest education level: Not on file  Occupational History   Not on file  Tobacco Use   Smoking status: Former    Packs/day: 0.50    Years: 10.00    Pack years: 5.00    Types: Cigarettes    Quit date: 03/21/1965    Years since quitting: 56.3   Smokeless  tobacco: Never  Vaping Use   Vaping Use: Never used  Substance and Sexual Activity   Alcohol use: No    Alcohol/week: 0.0 standard drinks  Drug use: Never   Sexual activity: Not on file  Other Topics Concern   Not on file  Social History Narrative   Level of education: High school    Employment: retired, [employer] maintenance       Social Determinants of Radio broadcast assistant Strain: Not on file  Food Insecurity: Not on file  Transportation Needs: Not on file  Physical Activity: Not on file  Stress: Not on file  Social Connections: Not on file     Family History: The patient's family history includes CVA in his mother; Heart attack in his father; Heart disease in his brother; Stroke in his mother. ROS:   Please see the history of present illness.    All other systems reviewed and are negative.  EKGs/Labs/Other Studies Reviewed:    The following studies were reviewed today:  EKG:  EKG ordered today and personally reviewed.  The ekg ordered today demonstrates sinus rhythm normal EKG  Recent Labs: No results found for requested labs within last 8760 hours.  Recent Lipid Panel    Component Value Date/Time   CHOL 93 01/28/2016 0453   TRIG 67 01/28/2016 0453   HDL 33 (L) 01/28/2016 0453   CHOLHDL 2.8 01/28/2016 0453   VLDL 13 01/28/2016 0453   LDLCALC 47 01/28/2016 0453    Physical Exam:    VS:  Ht 5\' 10"  (1.778 m)   Wt 168 lb (76.2 kg)   SpO2 97%   BMI 24.11 kg/m     Wt Readings from Last 3 Encounters:  07/23/21 168 lb (76.2 kg)  12/26/20 174 lb (78.9 kg)  01/13/20 171 lb 3.2 oz (77.7 kg)     GEN:  Well nourished, well developed in no acute distress HEENT: Normal NECK: No JVD; No carotid bruits LYMPHATICS: No lymphadenopathy CARDIAC: RRR, no murmurs, rubs, gallops RESPIRATORY:  Clear to auscultation without rales, wheezing or rhonchi  ABDOMEN: Soft, non-tender, non-distended MUSCULOSKELETAL:  No edema; No deformity  SKIN: Warm and dry NEUROLOGIC:   Alert and oriented x 3 PSYCHIATRIC:  Normal affect    Signed, Shirlee More, MD  07/23/2021 9:39 AM    [place]

## 2021-07-23 ENCOUNTER — Other Ambulatory Visit: Payer: Self-pay

## 2021-07-23 ENCOUNTER — Ambulatory Visit: Payer: Medicare HMO | Admitting: Cardiology

## 2021-07-23 ENCOUNTER — Encounter: Payer: Self-pay | Admitting: Cardiology

## 2021-07-23 VITALS — Ht 70.0 in | Wt 168.0 lb

## 2021-07-23 DIAGNOSIS — Z79899 Other long term (current) drug therapy: Secondary | ICD-10-CM

## 2021-07-23 DIAGNOSIS — E785 Hyperlipidemia, unspecified: Secondary | ICD-10-CM

## 2021-07-23 DIAGNOSIS — Z951 Presence of aortocoronary bypass graft: Secondary | ICD-10-CM

## 2021-07-23 DIAGNOSIS — I472 Ventricular tachycardia, unspecified: Secondary | ICD-10-CM | POA: Diagnosis not present

## 2021-07-23 DIAGNOSIS — I25118 Atherosclerotic heart disease of native coronary artery with other forms of angina pectoris: Secondary | ICD-10-CM | POA: Diagnosis not present

## 2021-07-23 DIAGNOSIS — I10 Essential (primary) hypertension: Secondary | ICD-10-CM | POA: Diagnosis not present

## 2021-07-23 NOTE — Patient Instructions (Signed)
Medication Instructions:  Your physician recommends that you continue on your current medications as directed. Please refer to the Current Medication list given to you today.  *If you need a refill on your cardiac medications before your next appointment, please call your pharmacy*   Lab Work: Your physician recommends that you return for lab work in: TODAY BMP, CBC If you have labs (blood work) drawn today and your tests are completely normal, you will receive your results only by: [place] (if you have MyChart) OR A paper copy in the mail If you have any lab test that is abnormal or we need to change your treatment, we will call you to review the results.   Testing/Procedures:  [facility] [address], [place] [address] Dept: [phone] Loc: [phone]  Dan Holmes  07/23/2021  You are scheduled for a Cardiac Catheterization on Friday, December 9 with Dr. [name].  1. Please arrive at the [facility] (Main Entrance A) at [facility]: [address] [place], [place] [address] at 5:30 AM (This time is two hours before your procedure to ensure your preparation). Free valet parking service is available.   Special note: Every effort is made to have your procedure done on time. Please understand that emergencies sometimes delay scheduled procedures.  2. Diet: Do not eat solid foods after midnight.  The patient may have clear liquids until 5am upon the day of the procedure.  3. Labs: YOU HAD YOUR LABS DRAWN TODAY 07/23/21  4. Medication instructions in preparation for your procedure:   Contrast Allergy: No  Do not take Diabetes Med Glucophage (Metformin) on the day of the procedure and HOLD 48 HOURS AFTER THE PROCEDURE.  On the morning of your procedure, take your Aspirin and any morning medicines NOT listed above.  You may use sips of water.  5. Plan  for one night stay--bring personal belongings. 6. Bring a current list of your medications and current insurance cards. 7. You MUST have a responsible person to drive you home. 8. Someone MUST be with you the first 24 hours after you arrive home or your discharge will be delayed. 9. Please wear clothes that are easy to get on and off and wear slip-on shoes.  Thank you for allowing [place] to care for you!   -- [facility]    Follow-Up: At [facility], you and your health needs are our priority.  As part of our continuing mission to provide you with exceptional heart care, we have created designated Provider Care Teams.  These Care Teams include your primary Cardiologist (physician) and Advanced Practice Providers (APPs -  Physician Assistants and Nurse Practitioners) who all work together to provide you with the care you need, when you need it.  We recommend signing up for the patient portal called "MyChart".  Sign up information is provided on this After Visit Summary.  MyChart is used to connect with patients for Virtual Visits (Telemedicine).  Patients are able to view lab/test results, encounter notes, upcoming appointments, etc.  Non-urgent messages can be sent to your provider as well.   To learn more about what you can do with MyChart, go to [email or web address].    Your next appointment:   6 week(s)  The format for your next appointment:   In Person  Provider:   Shirlee More, MD    Other Instructions

## 2021-07-24 ENCOUNTER — Telehealth: Payer: Self-pay

## 2021-07-24 LAB — BASIC METABOLIC PANEL
BUN/Creatinine Ratio: 20 (ref 10–24)
BUN: 30 mg/dL — ABNORMAL HIGH (ref 8–27)
CO2: 24 mmol/L (ref 20–29)
Calcium: 9.5 mg/dL (ref 8.6–10.2)
Chloride: 99 mmol/L (ref 96–106)
Creatinine, Ser: 1.53 mg/dL — ABNORMAL HIGH (ref 0.76–1.27)
Glucose: 120 mg/dL — ABNORMAL HIGH (ref 70–99)
Potassium: 4.7 mmol/L (ref 3.5–5.2)
Sodium: 136 mmol/L (ref 134–144)
eGFR: 44 mL/min/{1.73_m2} — ABNORMAL LOW (ref >59)

## 2021-07-24 LAB — CBC
Hematocrit: 39.6 % (ref 37.5–51.0)
Hemoglobin: 12.9 g/dL — ABNORMAL LOW (ref 13.0–17.7)
MCH: 28.9 pg (ref 26.6–33.0)
MCHC: 32.6 g/dL (ref 31.5–35.7)
MCV: 89 fL (ref 79–97)
Platelets: 194 10*3/uL (ref 150–450)
RBC: 4.47 x10E6/uL (ref 4.14–5.80)
RDW: 16 % — ABNORMAL HIGH (ref 11.6–15.4)
WBC: 8.5 10*3/uL (ref 3.4–10.8)

## 2021-07-24 NOTE — Telephone Encounter (Signed)
Spoke with patient regarding results and recommendation.  Patient verbalizes understanding and is agreeable to plan of care. Advised patient to call back with any issues or concerns.  

## 2021-07-24 NOTE — Telephone Encounter (Signed)
Spoke to the patient and the patients daughter just now and let them know these recommendations from Dr. Bettina Gavia. They verbalize understanding.   Encouraged patient to call back with any questions or concerns.

## 2021-07-24 NOTE — Telephone Encounter (Signed)
-----   Message from Richardo Priest, MD sent at 07/24/2021  7:49 AM EST ----- Stable results for cardiac cath I had stopped his ACE inhibitor last week

## 2021-07-25 ENCOUNTER — Encounter: Payer: Self-pay | Admitting: Cardiology

## 2021-07-26 ENCOUNTER — Telehealth: Payer: Self-pay | Admitting: *Deleted

## 2021-07-26 NOTE — Telephone Encounter (Signed)
Cardiac catheterization scheduled at [facility] for: Friday July 27, 2021 [facility] Main Entrance A [facility]) at: 7 AM   Diet-no solid food after midnight prior to cath, clear liquids until 5 AM day of procedure.  Medication instructions for procedure: -Hold:   Metformin-day of procedure and 48 hours post procedure -Except hold medication usual morning medications can be taken pre-cath with sips of water including aspirin 81 mg.    Confirmed patient has responsible adult to drive home post procedure and be with patient first 24 hours after arriving home.  [facility] does allow one visitor to accompany you and wait in the hospital waiting room while you are there for your procedure. You and your visitor will be asked to wear a mask once you enter the hospital.   Patient reports does not currently have any new symptoms concerning for COVID-19 and no household members with COVID-19 like illness.     Reviewed procedure/mask/visitor instructions with patient.

## 2021-07-27 ENCOUNTER — Other Ambulatory Visit: Payer: Self-pay

## 2021-07-27 ENCOUNTER — Encounter (HOSPITAL_COMMUNITY)
Admission: RE | Disposition: A | Discharge status: Home / Self Care | Payer: Self-pay | Source: Ambulatory Visit | Attending: Cardiology

## 2021-07-27 ENCOUNTER — Telehealth: Payer: Self-pay | Admitting: Cardiology

## 2021-07-27 ENCOUNTER — Ambulatory Visit (HOSPITAL_COMMUNITY)
Admission: RE | Admit: 2021-07-27 | Discharge: 2021-07-27 | Disposition: A | Discharge status: Home / Self Care | Payer: Medicare HMO | Source: Ambulatory Visit | Attending: Cardiology | Admitting: Cardiology

## 2021-07-27 DIAGNOSIS — R55 Syncope and collapse: Secondary | ICD-10-CM | POA: Diagnosis present

## 2021-07-27 DIAGNOSIS — I2510 Atherosclerotic heart disease of native coronary artery without angina pectoris: Secondary | ICD-10-CM | POA: Diagnosis not present

## 2021-07-27 DIAGNOSIS — Z79899 Other long term (current) drug therapy: Secondary | ICD-10-CM | POA: Diagnosis not present

## 2021-07-27 DIAGNOSIS — E785 Hyperlipidemia, unspecified: Secondary | ICD-10-CM | POA: Diagnosis not present

## 2021-07-27 DIAGNOSIS — Z951 Presence of aortocoronary bypass graft: Secondary | ICD-10-CM | POA: Diagnosis not present

## 2021-07-27 DIAGNOSIS — I472 Ventricular tachycardia, unspecified: Secondary | ICD-10-CM

## 2021-07-27 DIAGNOSIS — I25118 Atherosclerotic heart disease of native coronary artery with other forms of angina pectoris: Secondary | ICD-10-CM | POA: Insufficient documentation

## 2021-07-27 DIAGNOSIS — I10 Essential (primary) hypertension: Secondary | ICD-10-CM | POA: Insufficient documentation

## 2021-07-27 HISTORY — DX: Ventricular tachycardia, unspecified: I47.20

## 2021-07-27 HISTORY — DX: Syncope and collapse: R55

## 2021-07-27 HISTORY — PX: LEFT HEART CATH AND CORS/GRAFTS ANGIOGRAPHY: CATH118250

## 2021-07-27 LAB — GLUCOSE, CAPILLARY: Glucose-Capillary: 120 mg/dL — ABNORMAL HIGH (ref 70–99)

## 2021-07-27 SURGERY — LEFT HEART CATH AND CORS/GRAFTS ANGIOGRAPHY
Anesthesia: LOCAL

## 2021-07-27 MED ORDER — MIDAZOLAM HCL 2 MG/2ML IJ SOLN
INTRAMUSCULAR | Status: DC | PRN
  Administered 2021-07-27: 1 mg via INTRAVENOUS

## 2021-07-27 MED ORDER — HEPARIN SODIUM (PORCINE) 1000 UNIT/ML IJ SOLN
INTRAMUSCULAR | Status: DC | PRN
  Administered 2021-07-27: 4000 [IU] via INTRAVENOUS

## 2021-07-27 MED ORDER — SODIUM CHLORIDE 0.9 % IV SOLN: 250.0000 mL | INTRAVENOUS | Status: DC | PRN

## 2021-07-27 MED ORDER — VERAPAMIL HCL 2.5 MG/ML IV SOLN
INTRAVENOUS | Status: AC
  Filled 2021-07-27: qty 2

## 2021-07-27 MED ORDER — HYDRALAZINE HCL 20 MG/ML IJ SOLN
INTRAMUSCULAR | Status: AC
  Filled 2021-07-27: qty 1

## 2021-07-27 MED ORDER — SODIUM CHLORIDE 0.9% FLUSH: 3.0000 mL | Freq: Two times a day (BID) | INTRAVENOUS | Status: DC

## 2021-07-27 MED ORDER — SODIUM CHLORIDE 0.9% FLUSH: 3.0000 mL | INTRAVENOUS | Status: DC | PRN

## 2021-07-27 MED ORDER — ACETAMINOPHEN 325 MG PO TABS: 650.0000 mg | ORAL_TABLET | ORAL | Status: DC | PRN

## 2021-07-27 MED ORDER — HEPARIN (PORCINE) IN NACL 1000-0.9 UT/500ML-% IV SOLN
INTRAVENOUS | Status: DC | PRN
  Administered 2021-07-27 (×2): 500 mL

## 2021-07-27 MED ORDER — VERAPAMIL HCL 2.5 MG/ML IV SOLN
INTRAVENOUS | Status: DC | PRN
  Administered 2021-07-27: 10 mL via INTRA_ARTERIAL

## 2021-07-27 MED ORDER — SODIUM CHLORIDE 0.9 % WEIGHT BASED INFUSION
3.0000 mL/kg/h | INTRAVENOUS | Status: AC
  Administered 2021-07-27: 3 mL/kg/h via INTRAVENOUS

## 2021-07-27 MED ORDER — SODIUM CHLORIDE 0.9 % WEIGHT BASED INFUSION
1.0000 mL/kg/h | INTRAVENOUS | Status: DC
  Administered 2021-07-27: 1 mL/kg/h via INTRAVENOUS

## 2021-07-27 MED ORDER — ONDANSETRON HCL 4 MG/2ML IJ SOLN: 4.0000 mg | Freq: Four times a day (QID) | INTRAMUSCULAR | Status: DC | PRN

## 2021-07-27 MED ORDER — HEPARIN (PORCINE) IN NACL 1000-0.9 UT/500ML-% IV SOLN
INTRAVENOUS | Status: AC
  Filled 2021-07-27: qty 1000

## 2021-07-27 MED ORDER — HYDRALAZINE HCL 20 MG/ML IJ SOLN
INTRAMUSCULAR | Status: DC | PRN
  Administered 2021-07-27: 10 mg via INTRAVENOUS

## 2021-07-27 MED ORDER — HEPARIN SODIUM (PORCINE) 1000 UNIT/ML IJ SOLN
INTRAMUSCULAR | Status: AC
  Filled 2021-07-27: qty 10

## 2021-07-27 MED ORDER — LIDOCAINE HCL (PF) 1 % IJ SOLN
INTRAMUSCULAR | Status: AC
  Filled 2021-07-27: qty 30

## 2021-07-27 MED ORDER — LIDOCAINE HCL (PF) 1 % IJ SOLN
INTRAMUSCULAR | Status: DC | PRN
  Administered 2021-07-27: 2 mL

## 2021-07-27 MED ORDER — IOHEXOL 350 MG/ML SOLN
INTRAVENOUS | Status: DC | PRN
  Administered 2021-07-27: 120 mL

## 2021-07-27 MED ORDER — ASPIRIN 81 MG PO CHEW: 81.0000 mg | CHEWABLE_TABLET | ORAL | Status: DC

## 2021-07-27 MED ORDER — FENTANYL CITRATE (PF) 100 MCG/2ML IJ SOLN
INTRAMUSCULAR | Status: AC
  Filled 2021-07-27: qty 2

## 2021-07-27 MED ORDER — MIDAZOLAM HCL 2 MG/2ML IJ SOLN
INTRAMUSCULAR | Status: AC
  Filled 2021-07-27: qty 2

## 2021-07-27 MED ORDER — LABETALOL HCL 5 MG/ML IV SOLN: 10.0000 mg | INTRAVENOUS | Status: DC | PRN

## 2021-07-27 MED ORDER — SODIUM CHLORIDE 0.9 % IV SOLN: INTRAVENOUS | Status: DC

## 2021-07-27 MED ORDER — FENTANYL CITRATE (PF) 100 MCG/2ML IJ SOLN
INTRAMUSCULAR | Status: DC | PRN
  Administered 2021-07-27: 25 ug via INTRAVENOUS

## 2021-07-27 SURGICAL SUPPLY — 12 items
CATH INFINITI 5 FR IM (CATHETERS) ×2 IMPLANT
CATH INFINITI 5 FR JL3.5 (CATHETERS) ×2 IMPLANT
CATH INFINITI 5FR MULTPACK ANG (CATHETERS) ×2 IMPLANT
DEVICE RAD COMP TR BAND LRG (VASCULAR PRODUCTS) ×2 IMPLANT
ELECT DEFIB PAD ADLT CADENCE (PAD) ×2 IMPLANT
GLIDESHEATH SLEND SS 6F .021 (SHEATH) ×2 IMPLANT
GUIDEWIRE INQWIRE 1.5J.035X260 (WIRE) ×1 IMPLANT
INQWIRE 1.5J .035X260CM (WIRE) ×2
KIT HEART LEFT (KITS) ×2 IMPLANT
PACK CARDIAC CATHETERIZATION (CUSTOM PROCEDURE TRAY) ×2 IMPLANT
TRANSDUCER W/STOPCOCK (MISCELLANEOUS) ×2 IMPLANT
TUBING CIL FLEX 10 FLL-RA (TUBING) ×2 IMPLANT

## 2021-07-27 NOTE — Interval H&P Note (Signed)
History and Physical Interval Note:  07/27/2021 1:00 PM  Dan Holmes  has presented today for surgery, with the diagnosis of cad-CABG with Ventricular Tachycardia  The various methods of treatment have been discussed with the patient and family. After consideration of risks, benefits and other options for treatment, the patient has consented to  Procedure(s): LEFT HEART CATH AND CORS/GRAFTS ANGIOGRAPHY (N/A)  PERCUTANEOUS CORONARY INTERVENTION  as a surgical intervention.  The patient's history has been reviewed, patient examined, no change in status, stable for surgery.  I have reviewed the patient's chart and labs.  Questions were answered to the patient's satisfaction.    Cath Lab Visit (complete for each Cath Lab visit)  Clinical Evaluation Leading to the Procedure:   ACS: No.  Non-ACS:    Anginal Classification: No Symptoms -> Concern for Ischemic VT.   Anti-ischemic medical therapy: Minimal Therapy (1 class of medications)  Non-Invasive Test Results: No non-invasive testing performed  Prior CABG: Previous CABG   Dan Holmes

## 2021-07-27 NOTE — Telephone Encounter (Signed)
   Pt c/o medication issue:  1. Name of Medication: amiodarone   2. How are you currently taking this medication (dosage and times per day)?   3. Are you having a reaction (difficulty breathing--STAT)?   4. What is your medication issue? Mickel Baas calling, she said pt had his heart acth today and was told to call Dr. Bettina Gavia about his amiodarone. If opt needs to take this today or tomorrow

## 2021-07-30 ENCOUNTER — Encounter (HOSPITAL_COMMUNITY): Payer: Self-pay | Admitting: Cardiology

## 2021-08-02 DIAGNOSIS — Z8679 Personal history of other diseases of the circulatory system: Secondary | ICD-10-CM | POA: Diagnosis not present

## 2021-08-02 DIAGNOSIS — I10 Essential (primary) hypertension: Secondary | ICD-10-CM | POA: Diagnosis not present

## 2021-08-02 DIAGNOSIS — H81393 Other peripheral vertigo, bilateral: Secondary | ICD-10-CM | POA: Diagnosis not present

## 2021-08-02 DIAGNOSIS — I25119 Atherosclerotic heart disease of native coronary artery with unspecified angina pectoris: Secondary | ICD-10-CM | POA: Diagnosis not present

## 2021-08-02 DIAGNOSIS — Z87898 Personal history of other specified conditions: Secondary | ICD-10-CM | POA: Diagnosis not present

## 2021-08-02 DIAGNOSIS — Z6824 Body mass index (BMI) 24.0-24.9, adult: Secondary | ICD-10-CM | POA: Diagnosis not present

## 2021-08-15 DIAGNOSIS — J0140 Acute pansinusitis, unspecified: Secondary | ICD-10-CM | POA: Diagnosis not present

## 2021-08-15 DIAGNOSIS — Z6824 Body mass index (BMI) 24.0-24.9, adult: Secondary | ICD-10-CM | POA: Diagnosis not present

## 2021-09-09 NOTE — Progress Notes (Signed)
[place]  Cardiology Office Note:    Date:  09/10/2021   ID:  Dan Holmes, DOB 1931-11-04, MRN [id number]  PCP:  Street, [place], MD  Cardiologist:  Shirlee More, MD    Referring MD: [address], [place], *please check his liver TSH free T3 and free T4 every 6 months he is on amiodarone and tells me labs are done at your office   ASSESSMENT:    1. Ventricular tachycardia   2. On amiodarone therapy   3. Coronary artery disease of native artery of native heart with stable angina pectoris ([place])   4. Hx of CABG   5. Essential hypertension   6. Dyslipidemia   7. Bilateral carotid artery stenosis    PLAN:    In order of problems listed above:  He is doing well on amiodarone no recurrence of syncope tolerating low-dose He will need labs in his PCP office as detailed above Stable CAD patent grafts at the time of coronary angiography normal ejection fraction did not require revascularization BP at target he is no longer on ACE inhibitor and I told him there is no reason to restart Stable continue with statin due for labs in the next few weeks with his PCP Stable on most recent carotid duplex consider repeat this summer   Next appointment: 6 months   Medication Adjustments/Labs and Tests Ordered: Current medicines are reviewed at length with the patient today.  Concerns regarding medicines are outlined above.  Orders Placed This Encounter  Procedures   EKG 12-Lead   No orders of the defined types were placed in this encounter.  Follow-up he had sustained VT with syncope in the setting of CAD on amiodarone   History of Present Illness:    Dan Holmes is a 88 y.o. male with a hx of sustained VT with syncope treated with amiodarone CAD with remote bypass surgery 2005 hypertension dyslipidemia carotid stenosis last seen 07/23/2021.  He declined an ICD at hospital discharge.  Compliance with diet, lifestyle and medications: Yes  He  is back to all activities he has had no recurrence of syncope no edema shortness of breath chest pain or palpitation.  He is quite happy with the quality of his life and wanted to do labs today for toxicity he tells me scheduled next few weeks with his PCP  He had a carotid duplex performed 01/30/2021 mild right internal carotid stenosis 40 to 59%, mild left internal carotid artery stenosis 40 to 59%.  He has bypass surgery at [facility].  The operative note and discharge summary 02/22/2004 revealed that he had bypass surgery at the left thoracic artery anastomosed to the left anterior descending vein graft sequentially to the first and second marginal branches and a vein graft sequentially to the right coronary artery Posterior descending coronary artery.     After extensive discussion with patient and family goals for treatment he underwent left heart catheterization [facility] 07/27/2021 he has bypass grafts left thoracic artery to LAD vein graft to his first and second marginal and vein graft to PDA distal right coronary and PDA overall patent and there was no Denovo stenotic lesion and did not require PCI.  LEFT HEART CATH AND CORS/GRAFTS ANGIOGRAPHY   Conclusion      Ost RCA to Prox RCA lesion is 90% stenosed.  Mid RCA lesion is 60% stenosed - competitive flow   RPDA lesion is 90% stenosed.   Prox LAD to Mid LAD  lesion is 65% stenosed.  Mid LAD lesion is 100% stenosed.   Prox Cx to Mid Cx lesion is 100% stenosed with 100% stenosed side branch in 1st Mrg.   -----------------------------------------------   LIMA graft was visualized by angiography and is normal in caliber. The graft exhibits no disease.   Seq SVG- -OM1-OM2 graft was not injected and is large. The graft exhibits no disease.   Seq SVG- -dRCA-rPDA graft was visualized by angiography and is large. The graft exhibits no disease. There is competitive flow.   ------------------------------------------------   The  left ventricular systolic function is normal.   LV end diastolic pressure is normal.   There is no aortic valve stenosis.   SUMMARY Severe native CAD with diffuse ostial and proximal 90% RCA with competitive flow distally; occluded proximal to mid LCx at the bifurcation of 1st & 2nd Mrg branch, diffuse 80 to 90% proximal LAD with occlusion after large 2nd Sept branch. Widely patent grafts: LIMA-LAD, SeqSVG-1st-2nd(Mrg), SevSVG-dRCA-rPDA. Normal EF by Echo.  Normal EDP.  Past Medical History:  Diagnosis Date   Abdominal pain 01/27/2016   Abnormal carotid ultrasound 04/23/2018   Abnormal LFTs    Arthritis    ASCVD (arteriosclerotic cardiovascular disease)    CAD (coronary artery disease)    January, 2012, nuclear, EF 61%, normal   Carotid artery disease ([place])    Doppler, August, 2011, 85-63% RIC A., 1-49% LICA, followup one year   Carpal tunnel syndrome    Cholecystitis 11/13/2016   Chronic gout without tophus 06/19/2016   Coronary artery disease involving native coronary artery of native heart 06/28/2015   January, 2012, nuclear, EF 61%, normal   Cough due to ACE inhibitor 12/16/2014   The patient's case was changed to an ARB in March, 2016.    DDD (degenerative disc disease), cervical 06/19/2016   DDD (degenerative disc disease), lumbar 06/19/2016   Spinal Fusion   Diabetes mellitus    Dyslipidemia 05/28/2013   Joint pains that may have been related to Crestor.    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)    2012    Renal insufficiency     Past Surgical History:  Procedure Laterality Date   CHOLECYSTECTOMY     CORONARY ARTERY BYPASS GRAFT  2005   (Dr. Darcey Nora -- LIMA-LAD, SeqSVG-OM1-OM2, SeqSVG-dRCA-PDA   LEFT HEART CATH AND CORONARY ANGIOGRAPHY  02/13/2004   (Dr. Maurene Capes): p-mLAD long 80%, LCx branches into OM1& OM2 (~AVG occluded) - OM1 ~80%, LCx-OM2 ~80%, Ost-prox RCA 90%, Tandem 90% ostial & 80% proximal rPDA.  EF ~60% with inferior HK.   LEFT HEART CATH AND CORS/GRAFTS ANGIOGRAPHY N/A 07/27/2021   Procedure: LEFT HEART CATH AND CORS/GRAFTS ANGIOGRAPHY;  Surgeon: Leonie Man, MD;  Location: [facility];  Service: Cardiovascular;  Laterality: N/A;   SPINAL FUSION      Current Medications: Current Meds  Medication Sig   allopurinol (ZYLOPRIM) 300 MG tablet Take 1 tablet (300 mg total) by mouth daily.  amiodarone (PACERONE) 200 MG tablet Take 200 mg by mouth daily.   aspirin 81 MG tablet Take 81 mg by mouth every morning.    carvedilol (COREG) 25 MG tablet Take 12.5 mg by mouth 2 (two) times daily with a meal.   Cholecalciferol (VITAMIN D3) 5000 UNITS CAPS Take 5,000 Units by mouth every morning.   finasteride (PROSCAR) 5 MG tablet Take 5 mg by mouth every morning.    folic acid (FOLVITE) 1 MG tablet Take 1 tablet (1 mg total) by mouth every evening.   metFORMIN (GLUCOPHAGE) 500 MG tablet Take 500 mg by mouth daily.   methotrexate (RHEUMATREX) 2.5 MG tablet Take 10 mg by mouth once a week. Caution:Chemotherapy. Protect from light. Take 4 once a week   Multiple Vitamins-Minerals (PRESERVISION AREDS 2 PO) Take 1 tablet by mouth 2 (two) times daily.   simvastatin (ZOCOR) 40 MG tablet Take 40 mg by mouth daily.     Allergies:   Sulfa antibiotics   Social History   Socioeconomic History   Marital status: Married    Spouse name: Not on file   Number of children: Not on file   Years of education: Not on file   Highest education level: Not on file  Occupational History   Not on  file  Tobacco Use   Smoking status: Former    Packs/day: 0.50    Years: 10.00    Pack years: 5.00    Types: Cigarettes    Quit date: 03/21/1965    Years since quitting: 56.5   Smokeless tobacco: Never  Vaping Use   Vaping Use: Never used  Substance and Sexual Activity   Alcohol use: No    Alcohol/week: 0.0 standard drinks   Drug use: Never   Sexual activity: Not on file  Other Topics Concern   Not on file  Social History Narrative   Level of education: High school    Employment: retired, [employer] maintenance       Social Determinants of Radio broadcast assistant Strain: Not on file  Food Insecurity: Not on file  Transportation Needs: Not on file  Physical Activity: Not on file  Stress: Not on file  Social Connections: Not on file     Family History: The patient's family history includes CVA in his mother; Heart attack in his father; Heart disease in his brother; Stroke in his mother. ROS:   Please see the history of present illness.    All other systems reviewed and are negative.  EKGs/Labs/Other Studies Reviewed:    The following studies were reviewed today:  EKG:  EKG ordered today and personally reviewed.  The ekg ordered today demonstrates sinus rhythm normal QT interval normal EKG  Recent Labs: 07/23/2021: BUN 30; Creatinine, Ser 1.53; Hemoglobin 12.9; Platelets 194; Potassium 4.7; Sodium 136  Recent Lipid Panel    Component Value Date/Time   CHOL 93 01/28/2016 0453   TRIG 67 01/28/2016 0453   HDL 33 (L) 01/28/2016 0453   CHOLHDL 2.8 01/28/2016 0453   VLDL 13 01/28/2016 0453   LDLCALC 47 01/28/2016 0453    Physical Exam:    VS:  BP 132/60    Pulse 65    Ht 5\' 10"  (1.778 m)    Wt 172 lb (78 kg)    SpO2 95%    BMI 24.68 kg/m     Wt Readings from Last 3 Encounters:  09/10/21 172 lb (78 kg)  07/27/21 170 lb (77.1 kg)  07/23/21 168 lb (76.2  kg)     GEN:  Well nourished, well developed in no acute distress looks younger than his age 54: Normal NECK:  No JVD; No carotid bruits LYMPHATICS: No lymphadenopathy CARDIAC: RRR, no murmurs, rubs, gallops RESPIRATORY:  Clear to auscultation without rales, wheezing or rhonchi  ABDOMEN: Soft, non-tender, non-distended MUSCULOSKELETAL:  No edema; No deformity  SKIN: Warm and dry NEUROLOGIC:  Alert and oriented x 3 PSYCHIATRIC:  Normal affect    Signed, Shirlee More, MD  09/10/2021 10:52 AM    [place]

## 2021-09-10 ENCOUNTER — Ambulatory Visit: Payer: Medicare HMO | Admitting: Cardiology

## 2021-09-10 ENCOUNTER — Other Ambulatory Visit: Payer: Self-pay

## 2021-09-10 ENCOUNTER — Encounter: Payer: Self-pay | Admitting: Cardiology

## 2021-09-10 VITALS — BP 132/60 | HR 65 | Ht 70.0 in | Wt 172.0 lb

## 2021-09-10 DIAGNOSIS — I25118 Atherosclerotic heart disease of native coronary artery with other forms of angina pectoris: Secondary | ICD-10-CM | POA: Diagnosis not present

## 2021-09-10 DIAGNOSIS — I6523 Occlusion and stenosis of bilateral carotid arteries: Secondary | ICD-10-CM

## 2021-09-10 DIAGNOSIS — Z951 Presence of aortocoronary bypass graft: Secondary | ICD-10-CM | POA: Diagnosis not present

## 2021-09-10 DIAGNOSIS — Z79899 Other long term (current) drug therapy: Secondary | ICD-10-CM | POA: Diagnosis not present

## 2021-09-10 DIAGNOSIS — E785 Hyperlipidemia, unspecified: Secondary | ICD-10-CM | POA: Diagnosis not present

## 2021-09-10 DIAGNOSIS — I10 Essential (primary) hypertension: Secondary | ICD-10-CM

## 2021-09-10 DIAGNOSIS — I472 Ventricular tachycardia, unspecified: Secondary | ICD-10-CM

## 2021-09-10 NOTE — Patient Instructions (Signed)

## 2021-09-13 DIAGNOSIS — R69 Illness, unspecified: Secondary | ICD-10-CM | POA: Diagnosis not present

## 2021-09-13 DIAGNOSIS — H2703 Aphakia, bilateral: Secondary | ICD-10-CM | POA: Diagnosis not present

## 2021-09-13 DIAGNOSIS — E119 Type 2 diabetes mellitus without complications: Secondary | ICD-10-CM | POA: Diagnosis not present

## 2021-10-15 ENCOUNTER — Institutional Professional Consult (permissible substitution): Payer: Medicare HMO | Admitting: Cardiology

## 2021-11-13 DIAGNOSIS — E1151 Type 2 diabetes mellitus with diabetic peripheral angiopathy without gangrene: Secondary | ICD-10-CM | POA: Diagnosis not present

## 2021-11-13 DIAGNOSIS — E782 Mixed hyperlipidemia: Secondary | ICD-10-CM | POA: Diagnosis not present

## 2021-11-13 DIAGNOSIS — Z79899 Other long term (current) drug therapy: Secondary | ICD-10-CM | POA: Diagnosis not present

## 2021-11-13 DIAGNOSIS — L4050 Arthropathic psoriasis, unspecified: Secondary | ICD-10-CM | POA: Diagnosis not present

## 2021-11-13 DIAGNOSIS — M1A09X0 Idiopathic chronic gout, multiple sites, without tophus (tophi): Secondary | ICD-10-CM | POA: Diagnosis not present

## 2021-11-13 DIAGNOSIS — E559 Vitamin D deficiency, unspecified: Secondary | ICD-10-CM | POA: Diagnosis not present

## 2021-11-13 DIAGNOSIS — E1165 Type 2 diabetes mellitus with hyperglycemia: Secondary | ICD-10-CM | POA: Diagnosis not present

## 2021-11-20 DIAGNOSIS — N1831 Chronic kidney disease, stage 3a: Secondary | ICD-10-CM | POA: Diagnosis not present

## 2021-11-20 DIAGNOSIS — Z8679 Personal history of other diseases of the circulatory system: Secondary | ICD-10-CM | POA: Diagnosis not present

## 2021-11-20 DIAGNOSIS — I25119 Atherosclerotic heart disease of native coronary artery with unspecified angina pectoris: Secondary | ICD-10-CM | POA: Diagnosis not present

## 2021-11-20 DIAGNOSIS — I10 Essential (primary) hypertension: Secondary | ICD-10-CM | POA: Diagnosis not present

## 2021-11-20 DIAGNOSIS — M1A09X0 Idiopathic chronic gout, multiple sites, without tophus (tophi): Secondary | ICD-10-CM | POA: Diagnosis not present

## 2021-11-20 DIAGNOSIS — L4050 Arthropathic psoriasis, unspecified: Secondary | ICD-10-CM | POA: Diagnosis not present

## 2021-11-20 DIAGNOSIS — I70213 Atherosclerosis of native arteries of extremities with intermittent claudication, bilateral legs: Secondary | ICD-10-CM | POA: Diagnosis not present

## 2021-11-20 DIAGNOSIS — E1151 Type 2 diabetes mellitus with diabetic peripheral angiopathy without gangrene: Secondary | ICD-10-CM | POA: Diagnosis not present

## 2021-11-20 DIAGNOSIS — Z0000 Encounter for general adult medical examination without abnormal findings: Secondary | ICD-10-CM | POA: Diagnosis not present

## 2022-03-10 NOTE — Progress Notes (Signed)
Cardiology Office Note:    Date:  03/11/2022   ID:  Dan Holmes, DOB 1931/09/05, MRN [id number]  PCP:  Street, Dan [place], MD  Cardiologist:  Dan More, MD    Referring MD: [address], Dan Holmes, *    ASSESSMENT:    1. Ventricular tachycardia ([place])   2. On amiodarone therapy   3. Coronary artery disease of native artery of native heart with stable angina pectoris ([place])   4. Hx of CABG   5. Essential hypertension   6. Dyslipidemia    PLAN:    In order of problems listed above:  He is doing very well he has had no recurrent episodes of syncope surprisingly by telling me he never took amiodarone tells me he will not take it he did agree to wear a 1 week event monitor to see if he is having recurrent episodes of nonsustained ventricular tachycardia.  Continues medical therapy with CAD including aspirin beta-blocker he had stopped his statin. Other problems hypertension diabetes stable he will continue his current beta-blocker diabetes managed by his PCP   Next appointment: 6 months   Medication Adjustments/Labs and Tests Ordered: Current medicines are reviewed at length with the patient today.  Concerns regarding medicines are outlined above.  No orders of the defined types were placed in this encounter.  No orders of the defined types were placed in this encounter.   Chief Complaint  Patient presents with   Follow-up    On amiodarone     History of Present Illness:    Dan Holmes is a 88 y.o. male with a hx of CAD with remote bypass surgery 2005 ventricular tachycardia with syncope treated with amiodarone hypertension dyslipidemia carotid stenosis last seen 09/10/2021.  Compliance with diet, lifestyle and medications: Yes  He feels well very active careful about his health he has had no symptoms of palpitations syncope edema chest pain or shortness of breath Recent labs were improved A1c 6.1 CBC normal except for hemoglobin 11.7 creatinine 1.3  potassium 4.1 normal liver function test lipid profile cholesterol 79 LDL 27 and his thyroid studies TSH T3-T4 normal  He stopped his statin as follow-up labs planned for September  He had a carotid duplex performed 01/30/2021 mild right internal carotid stenosis 40 to 59%, mild left internal carotid artery stenosis 40 to 59%.  He has bypass surgery at [facility].  The operative note and discharge summary 02/22/2004 revealed that he had bypass surgery at the left thoracic artery anastomosed to the left anterior descending vein graft sequentially to the first and second marginal branches and a vein graft sequentially to the right coronary artery Posterior descending coronary artery.      After extensive discussion with patient and family goals for treatment he underwent left heart catheterization [facility] 07/27/2021 he has bypass grafts left thoracic artery to LAD vein graft to his first and second marginal and vein graft to PDA distal right coronary and PDA overall patent and there was no Denovo stenotic lesion and did not require PCI.   LEFT HEART CATH AND CORS/GRAFTS ANGIOGRAPHY   Conclusion   Ost RCA to Prox RCA lesion is 90% stenosed.  Mid RCA lesion is 60% stenosed - competitive flow   RPDA lesion is 90% stenosed.   Prox LAD to Mid LAD lesion is 65% stenosed.  Mid LAD lesion is 100% stenosed.   Prox Cx to Mid Cx lesion is 100% stenosed with 100% stenosed side branch in 1st Mrg.   -----------------------------------------------  LIMA graft was visualized by angiography and is normal in caliber. The graft exhibits no disease.   Seq SVG- -OM1-OM2 graft was not injected and is large. The graft exhibits no disease.   Seq SVG- -dRCA-rPDA graft was visualized by angiography and is large. The graft exhibits no disease. There is competitive flow.   ------------------------------------------------   The left ventricular systolic function is normal.   LV end diastolic pressure is  normal.   There is no aortic valve stenosis.   SUMMARY Severe native CAD with diffuse ostial and proximal 90% RCA with competitive flow distally; occluded proximal to mid LCx at the bifurcation of 1st & 2nd Mrg branch, diffuse 80 to 90% proximal LAD with occlusion after large 2nd Sept branch. Widely patent grafts: LIMA-LAD, SeqSVG-1st-2nd(Mrg), SevSVG-dRCA-rPDA. Normal EF by Echo.  Normal EDP.  Past Medical History:  Diagnosis Date   Abdominal pain 01/27/2016   Abnormal carotid ultrasound 04/23/2018   Abnormal LFTs    Arthritis    ASCVD (arteriosclerotic cardiovascular disease)    CAD (coronary artery disease)    January, 2012, nuclear, EF 61%, normal   Carotid artery disease ([place])    Doppler, August, 2011, 51-88% RIC A., 4-16% LICA, followup one year   Carpal tunnel syndrome    Cholecystitis 11/13/2016   Chronic gout without tophus 06/19/2016   Coronary artery disease involving native coronary artery of native heart 06/28/2015   January, 2012, nuclear, EF 61%, normal   Cough due to ACE inhibitor 12/16/2014   The patient's case was changed to an ARB in March, 2016.    DDD (degenerative disc disease), cervical 06/19/2016   DDD (degenerative disc disease), lumbar 06/19/2016   Spinal Fusion   Diabetes mellitus    Dyslipidemia 05/28/2013   Joint pains that may have been related to Crestor.    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 Darcey Nora -- LIMA-LAD, SeqSVG-OM1-OM2, SeqSVG-dRCA-PDA   LEFT HEART CATH AND CORONARY ANGIOGRAPHY  02/13/2004   (Dr. Maurene Capes): p-mLAD long 80%, LCx branches into OM1& OM2 (~AVG occluded) - OM1 ~80%, LCx-OM2 ~80%, Ost-prox RCA 90%, Tandem 90% ostial & 80% proximal rPDA.  EF ~60% with inferior HK.   LEFT HEART CATH AND CORS/GRAFTS ANGIOGRAPHY N/A 07/27/2021   Procedure: LEFT HEART CATH AND CORS/GRAFTS ANGIOGRAPHY;  Surgeon: Leonie Man, MD;  Location: [facility];  Service: Cardiovascular;  Laterality: N/A;   SPINAL FUSION      Current Medications: Current Meds  Medication Sig   allopurinol (ZYLOPRIM) 300 MG tablet Take 1 tablet (300 mg total) by mouth daily.   aspirin 81 MG tablet Take 81 mg by mouth every morning.    carvedilol (COREG) 25 MG tablet Take 12.5 mg by mouth 2 (two) times daily with a meal.   Cholecalciferol (VITAMIN  D3) 5000 UNITS CAPS Take 5,000 Units by mouth every morning.   folic acid (FOLVITE) 1 MG tablet Take 1 tablet (1 mg total) by mouth every evening.   metFORMIN (GLUCOPHAGE) 500 MG tablet Take 500 mg by mouth daily.   methotrexate (RHEUMATREX) 2.5 MG tablet Take 10 mg by mouth once a week. Caution:Chemotherapy. Protect from light. Take 4 once a week   Multiple Vitamins-Minerals (PRESERVISION AREDS 2 PO) Take 1 tablet by mouth 2 (two) times daily.     Allergies:   Sulfa antibiotics   Social History   Socioeconomic History   Marital status: Married    Spouse name: Not on file   Number of children: Not on file   Years of education: Not on file   Highest education level: Not on file  Occupational History   Not on file  Tobacco Use   Smoking status: Former    Packs/Dan: 0.50    Years: 10.00    Total pack years: 5.00    Types: Cigarettes    Quit date: 03/21/1965    Years since quitting: 57.0   Smokeless tobacco: Never  Vaping Use   Vaping Use: Never used  Substance and Sexual  Activity   Alcohol use: No    Alcohol/week: 0.0 standard drinks of alcohol   Drug use: Never   Sexual activity: Not on file  Other Topics Concern   Not on file  Social History Narrative   Level of education: High school    Employment: retired, [employer] maintenance       Social Determinants of Radio broadcast assistant Strain: Not on file  Food Insecurity: Not on file  Transportation Needs: Not on file  Physical Activity: Not on file  Stress: Not on file  Social Connections: Not on file     Family History: The patient's family history includes CVA in his mother; Heart attack in his father; Heart disease in his brother; Stroke in his mother. ROS:   Please see the history of present illness.    All other systems reviewed and are negative.  EKGs/Labs/Other Studies Reviewed:    The following studies were reviewed today:  EKG:  EKG ordered today and personally reviewed.  The ekg ordered today demonstrates sinus rhythm and is normal  Recent Labs: 07/23/2021: BUN 30; Creatinine, Ser 1.53; Hemoglobin 12.9; Platelets 194; Potassium 4.7; Sodium 136  Recent Lipid Panel    Component Value Date/Time   CHOL 93 01/28/2016 0453   TRIG 67 01/28/2016 0453   HDL 33 (L) 01/28/2016 0453   CHOLHDL 2.8 01/28/2016 0453   VLDL 13 01/28/2016 0453   LDLCALC 47 01/28/2016 0453    Physical Exam:    VS:  BP 140/70 (BP Location: Right Arm, Patient Position: Standing, Cuff Size: Normal)   Pulse 66   Ht '5\' 10"'$  (1.778 m)   Wt 176 lb (79.8 kg)   SpO2 96%   BMI 25.25 kg/m     Wt Readings from Last 3 Encounters:  03/11/22 176 lb (79.8 kg)  09/10/21 172 lb (78 kg)  07/27/21 170 lb (77.1 kg)     GEN:  Well nourished, well developed in no acute distress HEENT: Normal NECK: No JVD; No carotid bruits LYMPHATICS: No lymphadenopathy CARDIAC: RRR, no murmurs, rubs, gallops RESPIRATORY:  Clear to auscultation without rales, wheezing or rhonchi  ABDOMEN: Soft, non-tender,  non-distended MUSCULOSKELETAL:  No edema; No deformity  SKIN: Warm and dry NEUROLOGIC:  Alert and oriented x 3 PSYCHIATRIC:  Normal affect  Signed, Dan More, MD  03/11/2022 10:22 AM    [place]

## 2022-03-11 ENCOUNTER — Ambulatory Visit: Payer: Medicare HMO | Admitting: Cardiology

## 2022-03-11 ENCOUNTER — Encounter: Payer: Self-pay | Admitting: Cardiology

## 2022-03-11 VITALS — BP 140/70 | HR 66 | Ht 70.0 in | Wt 176.0 lb

## 2022-03-11 DIAGNOSIS — I25118 Atherosclerotic heart disease of native coronary artery with other forms of angina pectoris: Secondary | ICD-10-CM

## 2022-03-11 DIAGNOSIS — Z951 Presence of aortocoronary bypass graft: Secondary | ICD-10-CM

## 2022-03-11 DIAGNOSIS — Z79899 Other long term (current) drug therapy: Secondary | ICD-10-CM

## 2022-03-11 DIAGNOSIS — I472 Ventricular tachycardia, unspecified: Secondary | ICD-10-CM

## 2022-03-11 DIAGNOSIS — E785 Hyperlipidemia, unspecified: Secondary | ICD-10-CM | POA: Diagnosis not present

## 2022-03-11 DIAGNOSIS — I10 Essential (primary) hypertension: Secondary | ICD-10-CM

## 2022-03-11 NOTE — Patient Instructions (Signed)
Medication Instructions:  Your physician recommends that you continue on your current medications as directed. Please refer to the Current Medication list given to you today.  *If you need a refill on your cardiac medications before your next appointment, please call your pharmacy*   Lab Work: NONE If you have labs (blood work) drawn today and your tests are completely normal, you will receive your results only by: [place] (if you have MyChart) OR A paper copy in the mail If you have any lab test that is abnormal or we need to change your treatment, we will call you to review the results.   Testing/Procedures: NONE   Follow-Up: At [facility], you and your health needs are our priority.  As part of our continuing mission to provide you with exceptional heart care, we have created designated Provider Care Teams.  These Care Teams include your primary Cardiologist (physician) and Advanced Practice Providers (APPs -  Physician Assistants and Nurse Practitioners) who all work together to provide you with the care you need, when you need it.  We recommend signing up for the patient portal called "MyChart".  Sign up information is provided on this After Visit Summary.  MyChart is used to connect with patients for Virtual Visits (Telemedicine).  Patients are able to view lab/test results, encounter notes, upcoming appointments, etc.  Non-urgent messages can be sent to your provider as well.   To learn more about what you can do with MyChart, go to [email or web address].    Your next appointment:   6 month(s)  The format for your next appointment:   In Person  Provider:   Shirlee More, MD    Other Instructions   Important Information About Sugar

## 2022-05-15 DIAGNOSIS — M1A09X0 Idiopathic chronic gout, multiple sites, without tophus (tophi): Secondary | ICD-10-CM | POA: Diagnosis not present

## 2022-05-15 DIAGNOSIS — E559 Vitamin D deficiency, unspecified: Secondary | ICD-10-CM | POA: Diagnosis not present

## 2022-05-15 DIAGNOSIS — E1151 Type 2 diabetes mellitus with diabetic peripheral angiopathy without gangrene: Secondary | ICD-10-CM | POA: Diagnosis not present

## 2022-05-15 DIAGNOSIS — E782 Mixed hyperlipidemia: Secondary | ICD-10-CM | POA: Diagnosis not present

## 2022-05-15 DIAGNOSIS — L4050 Arthropathic psoriasis, unspecified: Secondary | ICD-10-CM | POA: Diagnosis not present

## 2022-05-22 DIAGNOSIS — N1831 Chronic kidney disease, stage 3a: Secondary | ICD-10-CM | POA: Diagnosis not present

## 2022-05-22 DIAGNOSIS — I70213 Atherosclerosis of native arteries of extremities with intermittent claudication, bilateral legs: Secondary | ICD-10-CM | POA: Diagnosis not present

## 2022-05-22 DIAGNOSIS — E1151 Type 2 diabetes mellitus with diabetic peripheral angiopathy without gangrene: Secondary | ICD-10-CM | POA: Diagnosis not present

## 2022-05-22 DIAGNOSIS — T466X5A Adverse effect of antihyperlipidemic and antiarteriosclerotic drugs, initial encounter: Secondary | ICD-10-CM | POA: Diagnosis not present

## 2022-05-22 DIAGNOSIS — I10 Essential (primary) hypertension: Secondary | ICD-10-CM | POA: Diagnosis not present

## 2022-05-22 DIAGNOSIS — Z23 Encounter for immunization: Secondary | ICD-10-CM | POA: Diagnosis not present

## 2022-05-22 DIAGNOSIS — I25119 Atherosclerotic heart disease of native coronary artery with unspecified angina pectoris: Secondary | ICD-10-CM | POA: Diagnosis not present

## 2022-05-22 DIAGNOSIS — E782 Mixed hyperlipidemia: Secondary | ICD-10-CM | POA: Diagnosis not present

## 2022-05-22 DIAGNOSIS — G720 Drug-induced myopathy: Secondary | ICD-10-CM | POA: Diagnosis not present

## 2022-08-21 DIAGNOSIS — R058 Other specified cough: Secondary | ICD-10-CM | POA: Diagnosis not present

## 2022-09-17 DIAGNOSIS — H2703 Aphakia, bilateral: Secondary | ICD-10-CM | POA: Diagnosis not present

## 2022-09-17 DIAGNOSIS — E119 Type 2 diabetes mellitus without complications: Secondary | ICD-10-CM | POA: Diagnosis not present

## 2022-09-23 DIAGNOSIS — B351 Tinea unguium: Secondary | ICD-10-CM | POA: Diagnosis not present

## 2022-09-23 DIAGNOSIS — L301 Dyshidrosis [pompholyx]: Secondary | ICD-10-CM | POA: Diagnosis not present

## 2022-09-23 DIAGNOSIS — B353 Tinea pedis: Secondary | ICD-10-CM | POA: Diagnosis not present

## 2022-10-22 DIAGNOSIS — Z6824 Body mass index (BMI) 24.0-24.9, adult: Secondary | ICD-10-CM | POA: Diagnosis not present

## 2022-10-22 DIAGNOSIS — N419 Inflammatory disease of prostate, unspecified: Secondary | ICD-10-CM | POA: Diagnosis not present

## 2022-10-22 DIAGNOSIS — N400 Enlarged prostate without lower urinary tract symptoms: Secondary | ICD-10-CM | POA: Diagnosis not present

## 2022-11-12 DIAGNOSIS — L4050 Arthropathic psoriasis, unspecified: Secondary | ICD-10-CM | POA: Diagnosis not present

## 2022-11-12 DIAGNOSIS — E559 Vitamin D deficiency, unspecified: Secondary | ICD-10-CM | POA: Diagnosis not present

## 2022-11-12 DIAGNOSIS — M1A09X0 Idiopathic chronic gout, multiple sites, without tophus (tophi): Secondary | ICD-10-CM | POA: Diagnosis not present

## 2022-11-12 DIAGNOSIS — E782 Mixed hyperlipidemia: Secondary | ICD-10-CM | POA: Diagnosis not present

## 2022-11-12 DIAGNOSIS — E1151 Type 2 diabetes mellitus with diabetic peripheral angiopathy without gangrene: Secondary | ICD-10-CM | POA: Diagnosis not present

## 2022-11-12 LAB — LAB REPORT - SCANNED
A1c: 6
Albumin, Urine POC: 71.6
Creatinine, POC: 113.7 mg/dL
EGFR: 59
Microalb Creat Ratio: 63

## 2022-11-18 DIAGNOSIS — N411 Chronic prostatitis: Secondary | ICD-10-CM | POA: Diagnosis not present

## 2022-11-18 DIAGNOSIS — M1A09X0 Idiopathic chronic gout, multiple sites, without tophus (tophi): Secondary | ICD-10-CM | POA: Diagnosis not present

## 2022-11-18 DIAGNOSIS — M47812 Spondylosis without myelopathy or radiculopathy, cervical region: Secondary | ICD-10-CM | POA: Diagnosis not present

## 2022-11-18 DIAGNOSIS — N401 Enlarged prostate with lower urinary tract symptoms: Secondary | ICD-10-CM | POA: Diagnosis not present

## 2022-11-18 DIAGNOSIS — G720 Drug-induced myopathy: Secondary | ICD-10-CM | POA: Diagnosis not present

## 2022-11-18 DIAGNOSIS — L4050 Arthropathic psoriasis, unspecified: Secondary | ICD-10-CM | POA: Diagnosis not present

## 2022-11-18 DIAGNOSIS — N138 Other obstructive and reflux uropathy: Secondary | ICD-10-CM | POA: Diagnosis not present

## 2022-11-18 DIAGNOSIS — N1831 Chronic kidney disease, stage 3a: Secondary | ICD-10-CM | POA: Diagnosis not present

## 2022-11-18 DIAGNOSIS — E782 Mixed hyperlipidemia: Secondary | ICD-10-CM | POA: Diagnosis not present

## 2022-11-18 DIAGNOSIS — E1151 Type 2 diabetes mellitus with diabetic peripheral angiopathy without gangrene: Secondary | ICD-10-CM | POA: Diagnosis not present

## 2022-12-17 NOTE — Progress Notes (Signed)
Cardiology Office Note:    Date:  12/18/2022   ID:  Dan Holmes, DOB 06/03/1932, MRN [id number]  PCP:  Street, Stephanie Coup, MD  Cardiologist:  Norman Herrlich, MD    Referring MD: [address], Stephanie Coup, *    ASSESSMENT:    1. Ventricular tachycardia (HCC)   2. On amiodarone therapy   3. Coronary artery disease of native artery of native heart with stable angina pectoris (H[facility])   4. Hx of CABG   5. Essential hypertension   6. Dyslipidemia   7. Bilateral carotid artery stenosis    PLAN:    In order of problems listed above:  He has had previous syncope ventricular tachycardia has been on amiodarone but discontinued.  Fortunately no recurrence we discussed using an event monitor and he declines he is doing well with medical therapy for CAD we will continue aspirin carvedilol and is not on lipid-lowering treatment his choice. He tells me his blood pressure consistently runs in the range of 120 or less systolic I asked him to check frequently at home and record Not on lipid-lowering therapy   Next appointment: 6 months We should see him more frequently as he is on amiodarone   Medication Adjustments/Labs and Tests Ordered: Current medicines are reviewed at length with the patient today.  Concerns regarding medicines are outlined above.  No orders of the defined types were placed in this encounter.  No orders of the defined types were placed in this encounter.   Chief Complaint  Patient presents with   Follow-up   Coronary Artery Disease    History of Present Illness:    Dan Holmes is a 89 y.o. male with a hx of CAD with remote CABG 2005 ventricular tachycardia with syncope treated with amiodarone suppression hypertension dyslipidemia and carotid stenosis last seen 03/11/2022.  Compliance with diet, lifestyle and medications: Yes  Previously on amiodarone he is not now He thinks he has made a full recovery he is active and is not having edema shortness of  breath chest pain palpitation or syncope. He is predominantly bothered by arthritis and sees rheumatology. Past Medical History:  Diagnosis Date   Abdominal pain 01/27/2016   Abnormal carotid ultrasound 04/23/2018   Abnormal LFTs    Arthritis    ASCVD (arteriosclerotic cardiovascular disease)    CAD (coronary artery disease)    January, 2012, nuclear, EF 61%, normal   Carotid artery disease ([facility])    Doppler, August, 2011, 40-59% RIC A., 0-39% LICA, followup one year   Carpal tunnel syndrome    Cholecystitis 11/13/2016   Chronic gout without tophus 06/19/2016   Coronary artery disease involving native coronary artery of native heart 06/28/2015   January, 2012, nuclear, EF 61%, normal   Cough due to ACE inhibitor 12/16/2014   The patient's case was changed to an ARB in March, 2016.    DDD (degenerative disc disease), cervical 06/19/2016   DDD (degenerative disc disease), lumbar 06/19/2016   Spinal Fusion   Diabetes mellitus    Dyslipidemia 05/28/2013   Joint pains that may have been related to Crestor.    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 Maren Beach -- LIMA-LAD, SeqSVG-OM1-OM2, SeqSVG-dRCA-PDA   LEFT HEART CATH AND CORONARY ANGIOGRAPHY  02/13/2004   (Dr. Dickie La): p-mLAD long 80%, LCx branches into OM1& OM2 (~AVG occluded) - OM1 ~80%, LCx-OM2 ~80%, Ost-prox RCA 90%,  Tandem 90% ostial & 80% proximal rPDA.  EF ~60% with inferior HK.   LEFT HEART CATH AND CORS/GRAFTS ANGIOGRAPHY N/A 07/27/2021   Procedure: LEFT HEART CATH AND CORS/GRAFTS ANGIOGRAPHY;  Surgeon: Marykay Lex, MD;  Location: [facility];  Service: Cardiovascular;  Laterality: N/A;   SPINAL FUSION      Current Medications: Current Meds  Medication Sig   allopurinol (ZYLOPRIM) 300 MG tablet Take 1 tablet (300 mg total) by mouth daily.   aspirin 81 MG tablet Take 81 mg by mouth every morning.    carvedilol (COREG) 25 MG tablet Take 12.5 mg by mouth 2 (two) times daily with a meal.   Cholecalciferol (VITAMIN D3) 5000 UNITS CAPS Take 5,000 Units by mouth every morning.   folic acid (FOLVITE) 1 MG tablet Take 1 tablet (1 mg total) by mouth every evening.   metFORMIN (GLUCOPHAGE) 500 MG tablet Take 500 mg by mouth daily.   methotrexate (RHEUMATREX) 2.5 MG tablet Take 10 mg by mouth once a week. Caution:Chemotherapy. Protect from light. Take 4 once a week   Multiple Vitamins-Minerals (PRESERVISION AREDS 2 PO) Take 1 tablet by mouth 2 (two) times daily.     Allergies:   Sulfa antibiotics   Social History   Socioeconomic History   Marital status: Married    Spouse name: Not on file   Number of children: Not on file   Years of education: Not on file   Highest education level: Not on file  Occupational History   Not on file  Tobacco Use   Smoking status: Former    Packs/day: 0.50    Years: 10.00    Additional pack years: 0.00    Total pack years: 5.00    Types: Cigarettes    Quit date: 03/21/1965    Years since quitting: 57.7   Smokeless tobacco: Never  Vaping Use   Vaping Use: Never used  Substance and Sexual Activity   Alcohol use: No    Alcohol/week: 0.0 standard drinks of alcohol   Drug use: Never   Sexual activity: Not on file  Other Topics Concern   Not on file  Social History Narrative   Level of education: High school    Employment: retired, [employer] maintenance        Social Determinants of Corporate investment banker Strain: Not on file  Food Insecurity: Not on file  Transportation Needs: Not on file  Physical Activity: Not on file  Stress: Not on file  Social Connections: Not on file     Family History: The patient's family history includes CVA in his mother; Heart attack in his father; Heart disease in his brother; Stroke in his mother. ROS:   Please see the history of present illness.    All other systems reviewed and are negative.  EKGs/Labs/Other Studies Reviewed:    The following studies were reviewed today:  Cardiac Studies & Procedures   CARDIAC CATHETERIZATION  CARDIAC CATHETERIZATION 07/27/2021  Narrative   Ost RCA to Prox RCA lesion is 90% stenosed.  Mid RCA lesion is 60% stenosed - competitive flow   RPDA lesion is 90% stenosed.   Prox LAD to Mid LAD lesion is 65% stenosed.  Mid LAD lesion is 100% stenosed.   Prox Cx to Mid Cx lesion is 100% stenosed with 100% stenosed side branch in 1st Mrg.   -----------------------------------------------   LIMA graft was visualized by angiography and is normal in caliber. The graft exhibits no disease.   Seq SVG- -OM1-OM2 graft was not injected and is large. The graft exhibits no disease.   Seq SVG- -dRCA-rPDA graft was visualized by angiography and is large. The graft exhibits no disease. There is competitive flow.   ------------------------------------------------   The left ventricular systolic function is normal.   LV end diastolic pressure is normal.   There is no aortic valve stenosis.  SUMMARY Severe native CAD with diffuse ostial and proximal 90% RCA with competitive flow distally; occluded proximal to mid LCx at the bifurcation of 1st & 2nd Mrg branch, diffuse 80 to 90% proximal LAD with occlusion after large 2nd Sept branch. Widely patent grafts: LIMA-LAD, SeqSVG-1st-2nd(Mrg), SevSVG-dRCA-rPDA. Normal EF by Echo.  Normal EDP.   RECOMMENDATIONS Return to Dr. Dulce Sellar for  ongoing care.  Consider EP consultation versus simply continuing amiodarone.   Bryan Lemma, MD  Findings Coronary Findings Diagnostic  Dominance: Right  Left Anterior Descending Prox LAD to Mid LAD lesion is 65% stenosed. The lesion is segmental and irregular. Mid LAD lesion is 100% stenosed. The lesion is chronically occluded.  First Diagonal Branch Vessel is small in size.  First Septal Branch Vessel is small in size.  Second Diagonal Branch Vessel is small in size.  Second Septal Branch Vessel is small in size.  Third Diagonal Branch Vessel is small in size.  Left Circumflex Prox Cx to Mid Cx lesion is 100% stenosed with 100% stenosed side branch in 1st Mrg. The lesion is chronically occluded.  Right Coronary Artery Ost RCA to Prox RCA lesion is 90% stenosed. The lesion is segmental, concentric and irregular. Mid RCA lesion is 60% stenosed.  Acute Marginal Branch Vessel is small in size.  Right Ventricular Branch Vessel is small in size.  Right Posterior Descending Artery RPDA lesion is 90% stenosed.  LIMA LIMA Graft To Dist LAD LIMA graft was visualized by angiography and is normal in caliber.  The graft exhibits no disease.  Sequential Jump Graft Graft To 1st Mrg, 2nd Mrg Seq SVG- -OM1-OM2 graft was not injected and is large.  The graft exhibits no disease.  Sequential Jump Graft Graft To Dist RCA, RPDA Seq SVG- -dRCA-rPDA graft was visualized by angiography and is large.  The graft exhibits no disease. There is competitive flow.  Intervention  No interventions have been documented.   STRESS TESTS  MYOCARDIAL PERFUSION IMAGING 03/04/2018  Narrative  Nuclear stress EF: 54%. There is septal wall hypokinesis consistent with post bypass surgery changes.  There was no ST segment deviation noted during stress. PVCs noted.  Defect 1: There is a medium defect of moderate severity present in the basal inferoseptal, basal inferior, apical anterior and  apical septal location. Defects are fixed, no ischemia.  This is a low risk study. No ischemia identified.  Donato Schultz, MD              EKG:  EKG ordered today and personally  reviewed.  The ekg ordered today demonstrates sinus rhythm with occasional PVC  Recent Labs: No results found for requested labs within last 365 days.  Recent Lipid Panel    Component Value Date/Time   CHOL 93 01/28/2016 0453   TRIG 67 01/28/2016 0453   HDL 33 (L) 01/28/2016 0453   CHOLHDL 2.8 01/28/2016 0453   VLDL 13 01/28/2016 0453   LDLCALC 47 01/28/2016 0453    Physical Exam:    VS:  BP (!) 150/76 (BP Location: Left Arm, Patient Position: Standing) Comment (Patient Position): Pt requested standing BP  Pulse 68   Ht 5\' 10"  (1.778 m)   Wt 163 lb (73.9 kg)   SpO2 96%   BMI 23.39 kg/m     Wt Readings from Last 3 Encounters:  12/18/22 163 lb (73.9 kg)  03/11/22 176 lb (79.8 kg)  09/10/21 172 lb (78 kg)     GEN: Looks younger than his age well nourished, well developed in no acute distress HEENT: Normal NECK: No JVD; No carotid bruits LYMPHATICS: No lymphadenopathy CARDIAC: RRR, no murmurs, rubs, gallops RESPIRATORY:  Clear to auscultation without rales, wheezing or rhonchi  ABDOMEN: Soft, non-tender, non-distended MUSCULOSKELETAL:  No edema; No deformity  SKIN: Warm and dry NEUROLOGIC:  Alert and oriented x 3 PSYCHIATRIC:  Normal affect    Signed, Norman Herrlich, MD  12/18/2022 9:25 AM    [facility]

## 2022-12-18 ENCOUNTER — Ambulatory Visit: Payer: Medicare HMO | Attending: Cardiology | Admitting: Cardiology

## 2022-12-18 ENCOUNTER — Encounter: Payer: Self-pay | Admitting: Cardiology

## 2022-12-18 VITALS — BP 150/76 | HR 68 | Ht 70.0 in | Wt 163.0 lb

## 2022-12-18 DIAGNOSIS — I25118 Atherosclerotic heart disease of native coronary artery with other forms of angina pectoris: Secondary | ICD-10-CM

## 2022-12-18 DIAGNOSIS — I472 Ventricular tachycardia, unspecified: Secondary | ICD-10-CM | POA: Diagnosis not present

## 2022-12-18 DIAGNOSIS — Z79899 Other long term (current) drug therapy: Secondary | ICD-10-CM

## 2022-12-18 DIAGNOSIS — I10 Essential (primary) hypertension: Secondary | ICD-10-CM | POA: Diagnosis not present

## 2022-12-18 DIAGNOSIS — E785 Hyperlipidemia, unspecified: Secondary | ICD-10-CM

## 2022-12-18 DIAGNOSIS — I6523 Occlusion and stenosis of bilateral carotid arteries: Secondary | ICD-10-CM

## 2022-12-18 DIAGNOSIS — Z951 Presence of aortocoronary bypass graft: Secondary | ICD-10-CM | POA: Diagnosis not present

## 2022-12-18 NOTE — Patient Instructions (Signed)
Medication Instructions:  Your physician recommends that you continue on your current medications as directed. Please refer to the Current Medication list given to you today.  *If you need a refill on your cardiac medications before your next appointment, please call your pharmacy*   Lab Work: None If you have labs (blood work) drawn today and your tests are completely normal, you will receive your results only by: MyChart Message (if you have MyChart) OR A paper copy in the mail If you have any lab test that is abnormal or we need to change your treatment, we will call you to review the results.   Testing/Procedures: None   Follow-Up: At [facility], you and your health needs are our priority.  As part of our continuing mission to provide you with exceptional heart care, we have created designated Provider Care Teams.  These Care Teams include your primary Cardiologist (physician) and Advanced Practice Providers (APPs -  Physician Assistants and Nurse Practitioners) who all work together to provide you with the care you need, when you need it.  We recommend signing up for the patient portal called "MyChart".  Sign up information is provided on this After Visit Summary.  MyChart is used to connect with patients for Virtual Visits (Telemedicine).  Patients are able to view lab/test results, encounter notes, upcoming appointments, etc.  Non-urgent messages can be sent to your provider as well.   To learn more about what you can do with MyChart, go to [email or web address].    Your next appointment:   6 month(s)  Provider:   Brian Munley, MD    Other Instructions None  

## 2023-05-19 DIAGNOSIS — M1A09X0 Idiopathic chronic gout, multiple sites, without tophus (tophi): Secondary | ICD-10-CM | POA: Diagnosis not present

## 2023-05-19 DIAGNOSIS — L4050 Arthropathic psoriasis, unspecified: Secondary | ICD-10-CM | POA: Diagnosis not present

## 2023-05-19 DIAGNOSIS — E1151 Type 2 diabetes mellitus with diabetic peripheral angiopathy without gangrene: Secondary | ICD-10-CM | POA: Diagnosis not present

## 2023-05-19 DIAGNOSIS — E559 Vitamin D deficiency, unspecified: Secondary | ICD-10-CM | POA: Diagnosis not present

## 2023-05-19 DIAGNOSIS — E782 Mixed hyperlipidemia: Secondary | ICD-10-CM | POA: Diagnosis not present

## 2023-05-23 DIAGNOSIS — I25119 Atherosclerotic heart disease of native coronary artery with unspecified angina pectoris: Secondary | ICD-10-CM | POA: Diagnosis not present

## 2023-05-23 DIAGNOSIS — Z0000 Encounter for general adult medical examination without abnormal findings: Secondary | ICD-10-CM | POA: Diagnosis not present

## 2023-05-23 DIAGNOSIS — Z23 Encounter for immunization: Secondary | ICD-10-CM | POA: Diagnosis not present

## 2023-05-23 DIAGNOSIS — G720 Drug-induced myopathy: Secondary | ICD-10-CM | POA: Diagnosis not present

## 2023-05-23 DIAGNOSIS — E1151 Type 2 diabetes mellitus with diabetic peripheral angiopathy without gangrene: Secondary | ICD-10-CM | POA: Diagnosis not present

## 2023-05-23 DIAGNOSIS — T466X5A Adverse effect of antihyperlipidemic and antiarteriosclerotic drugs, initial encounter: Secondary | ICD-10-CM | POA: Diagnosis not present

## 2023-05-23 DIAGNOSIS — I10 Essential (primary) hypertension: Secondary | ICD-10-CM | POA: Diagnosis not present

## 2023-05-23 DIAGNOSIS — I70213 Atherosclerosis of native arteries of extremities with intermittent claudication, bilateral legs: Secondary | ICD-10-CM | POA: Diagnosis not present

## 2023-05-23 DIAGNOSIS — E782 Mixed hyperlipidemia: Secondary | ICD-10-CM | POA: Diagnosis not present

## 2023-08-21 DIAGNOSIS — E1151 Type 2 diabetes mellitus with diabetic peripheral angiopathy without gangrene: Secondary | ICD-10-CM | POA: Diagnosis not present

## 2023-08-21 DIAGNOSIS — I25119 Atherosclerotic heart disease of native coronary artery with unspecified angina pectoris: Secondary | ICD-10-CM | POA: Diagnosis not present

## 2023-08-21 DIAGNOSIS — J40 Bronchitis, not specified as acute or chronic: Secondary | ICD-10-CM | POA: Diagnosis not present

## 2023-08-21 DIAGNOSIS — I10 Essential (primary) hypertension: Secondary | ICD-10-CM | POA: Diagnosis not present

## 2023-08-21 DIAGNOSIS — Z6824 Body mass index (BMI) 24.0-24.9, adult: Secondary | ICD-10-CM | POA: Diagnosis not present

## 2023-09-30 DIAGNOSIS — I10 Essential (primary) hypertension: Secondary | ICD-10-CM | POA: Diagnosis not present

## 2023-09-30 DIAGNOSIS — N1831 Chronic kidney disease, stage 3a: Secondary | ICD-10-CM | POA: Diagnosis not present

## 2023-09-30 DIAGNOSIS — N410 Acute prostatitis: Secondary | ICD-10-CM | POA: Diagnosis not present

## 2023-09-30 DIAGNOSIS — Z6824 Body mass index (BMI) 24.0-24.9, adult: Secondary | ICD-10-CM | POA: Diagnosis not present

## 2023-09-30 DIAGNOSIS — E1151 Type 2 diabetes mellitus with diabetic peripheral angiopathy without gangrene: Secondary | ICD-10-CM | POA: Diagnosis not present

## 2023-09-30 DIAGNOSIS — R300 Dysuria: Secondary | ICD-10-CM | POA: Diagnosis not present

## 2023-11-04 DIAGNOSIS — L820 Inflamed seborrheic keratosis: Secondary | ICD-10-CM | POA: Diagnosis not present

## 2023-11-04 DIAGNOSIS — L821 Other seborrheic keratosis: Secondary | ICD-10-CM | POA: Diagnosis not present

## 2023-11-04 DIAGNOSIS — L570 Actinic keratosis: Secondary | ICD-10-CM | POA: Diagnosis not present

## 2023-11-04 DIAGNOSIS — B351 Tinea unguium: Secondary | ICD-10-CM | POA: Diagnosis not present

## 2023-11-14 DIAGNOSIS — L4050 Arthropathic psoriasis, unspecified: Secondary | ICD-10-CM | POA: Diagnosis not present

## 2023-11-14 DIAGNOSIS — E1151 Type 2 diabetes mellitus with diabetic peripheral angiopathy without gangrene: Secondary | ICD-10-CM | POA: Diagnosis not present

## 2023-11-14 DIAGNOSIS — M1A09X0 Idiopathic chronic gout, multiple sites, without tophus (tophi): Secondary | ICD-10-CM | POA: Diagnosis not present

## 2023-11-14 DIAGNOSIS — E782 Mixed hyperlipidemia: Secondary | ICD-10-CM | POA: Diagnosis not present

## 2023-11-14 DIAGNOSIS — E559 Vitamin D deficiency, unspecified: Secondary | ICD-10-CM | POA: Diagnosis not present

## 2023-11-20 ENCOUNTER — Ambulatory Visit: Attending: Cardiology

## 2023-11-20 VITALS — BP 128/80 | HR 66 | Ht 70.0 in | Wt 172.2 lb

## 2023-11-20 DIAGNOSIS — R42 Dizziness and giddiness: Secondary | ICD-10-CM

## 2023-11-20 NOTE — Progress Notes (Signed)
   Nurse Visit   Date of Encounter: 11/20/2023 ID: Natividad Brood, DOB 01/10/1932, MRN [id number]  PCP:  Street, Stephanie Coup, MD   [facility] Providers Cardiologist:  [facility]      Visit Details   VS:  BP 128/80 (BP Location: Left Arm, Patient Position: Sitting, Cuff Size: Normal)   Pulse 66   Ht 5\' 10"  (1.778 m)   Wt 172 lb 3.2 oz (78.1 kg)   SpO2 98%   BMI 24.71 kg/m  , BMI Body mass index is 24.71 kg/m.  Wt Readings from Last 3 Encounters:  11/20/23 172 lb 3.2 oz (78.1 kg)  12/18/22 163 lb (73.9 kg)  03/11/22 176 lb (79.8 kg)     Reason for visit: EKG for dizziness and heart fluttering Performed today: Vitals, EKG, Provider consulted:Krasowski, and Education Changes (medications, testing, etc.) : 14 day zio Length of Visit: 30 minutes    Medications Adjustments/Labs and Tests Ordered: Orders Placed This Encounter  Procedures   EKG 12-Lead   No orders of the defined types were placed in this encounter.    Signed, Eleonore Chiquito, RN  11/20/2023 11:41 AM

## 2023-11-21 DIAGNOSIS — G720 Drug-induced myopathy: Secondary | ICD-10-CM | POA: Diagnosis not present

## 2023-11-21 DIAGNOSIS — T466X5A Adverse effect of antihyperlipidemic and antiarteriosclerotic drugs, initial encounter: Secondary | ICD-10-CM | POA: Diagnosis not present

## 2023-11-21 DIAGNOSIS — I25119 Atherosclerotic heart disease of native coronary artery with unspecified angina pectoris: Secondary | ICD-10-CM | POA: Diagnosis not present

## 2023-11-21 DIAGNOSIS — E782 Mixed hyperlipidemia: Secondary | ICD-10-CM | POA: Diagnosis not present

## 2023-11-21 DIAGNOSIS — I10 Essential (primary) hypertension: Secondary | ICD-10-CM | POA: Diagnosis not present

## 2023-11-21 DIAGNOSIS — E1151 Type 2 diabetes mellitus with diabetic peripheral angiopathy without gangrene: Secondary | ICD-10-CM | POA: Diagnosis not present

## 2023-11-21 DIAGNOSIS — I70213 Atherosclerosis of native arteries of extremities with intermittent claudication, bilateral legs: Secondary | ICD-10-CM | POA: Diagnosis not present

## 2023-11-21 DIAGNOSIS — N1831 Chronic kidney disease, stage 3a: Secondary | ICD-10-CM | POA: Diagnosis not present

## 2023-11-21 DIAGNOSIS — L4050 Arthropathic psoriasis, unspecified: Secondary | ICD-10-CM | POA: Diagnosis not present

## 2023-11-26 DIAGNOSIS — N179 Acute kidney failure, unspecified: Secondary | ICD-10-CM | POA: Insufficient documentation

## 2023-11-26 DIAGNOSIS — I951 Orthostatic hypotension: Secondary | ICD-10-CM | POA: Insufficient documentation

## 2023-11-26 DIAGNOSIS — N400 Enlarged prostate without lower urinary tract symptoms: Secondary | ICD-10-CM

## 2023-11-26 DIAGNOSIS — N390 Urinary tract infection, site not specified: Secondary | ICD-10-CM

## 2023-11-26 DIAGNOSIS — E860 Dehydration: Secondary | ICD-10-CM

## 2023-11-26 DIAGNOSIS — D649 Anemia, unspecified: Secondary | ICD-10-CM

## 2023-11-26 HISTORY — DX: Urinary tract infection, site not specified: N39.0

## 2023-11-26 HISTORY — DX: Orthostatic hypotension: I95.1

## 2023-11-26 HISTORY — DX: Dehydration: E86.0

## 2023-11-26 HISTORY — DX: Benign prostatic hyperplasia without lower urinary tract symptoms: N40.0

## 2023-11-26 HISTORY — DX: Acute kidney failure, unspecified: N17.9

## 2023-11-26 HISTORY — DX: Anemia, unspecified: D64.9

## 2023-11-27 ENCOUNTER — Ambulatory Visit

## 2023-11-27 VITALS — BP 138/72 | HR 64 | Ht 70.0 in | Wt 176.4 lb

## 2023-11-27 DIAGNOSIS — I25118 Atherosclerotic heart disease of native coronary artery with other forms of angina pectoris: Secondary | ICD-10-CM | POA: Diagnosis not present

## 2023-11-27 DIAGNOSIS — E785 Hyperlipidemia, unspecified: Secondary | ICD-10-CM | POA: Diagnosis not present

## 2023-11-27 DIAGNOSIS — I10 Essential (primary) hypertension: Secondary | ICD-10-CM | POA: Diagnosis not present

## 2023-11-27 DIAGNOSIS — I472 Ventricular tachycardia, unspecified: Secondary | ICD-10-CM | POA: Diagnosis not present

## 2023-11-27 DIAGNOSIS — I779 Disorder of arteries and arterioles, unspecified: Secondary | ICD-10-CM | POA: Diagnosis not present

## 2023-11-27 DIAGNOSIS — I340 Nonrheumatic mitral (valve) insufficiency: Secondary | ICD-10-CM

## 2023-11-27 DIAGNOSIS — I351 Nonrheumatic aortic (valve) insufficiency: Secondary | ICD-10-CM

## 2023-11-27 DIAGNOSIS — R55 Syncope and collapse: Secondary | ICD-10-CM | POA: Diagnosis not present

## 2023-11-27 HISTORY — DX: Nonrheumatic mitral (valve) insufficiency: I34.0

## 2023-11-27 HISTORY — DX: Nonrheumatic aortic (valve) insufficiency: I35.1

## 2023-11-27 MED ORDER — AMIODARONE HCL 200 MG PO TABS: 200.0000 mg | ORAL_TABLET | ORAL | 3 refills | Status: DC

## 2023-11-27 NOTE — Assessment & Plan Note (Signed)
 Follow-up echocardiogram.

## 2023-11-27 NOTE — Assessment & Plan Note (Signed)
 Does have significant cardiovascular risk factors and prior history of sustained VT that was symptomatic and he has not been on any medications other than beta-blockers. Heart monitor currently on 14-day study pending.  Will review results once available.  Will reassess with transthoracic echocardiogram for significant LV functional changes or new wall motion abnormalities. Recent electrolyte levels per patient were normal. Advised him to start taking magnesium over-the-counter supplement that he purchased.  Will assess ultrasound carotids that are being ordered.  Advised him to keep himself well-hydrated keep activity to mild to moderate exertion and take rest at the onset of symptoms.

## 2023-11-27 NOTE — Patient Instructions (Addendum)
 Medication Instructions:    Start Amiodarone 200 mg twice a day for 4 weeks. Then take Amiodarone 200 mg once a day.    *If you need a refill on your cardiac medications before your next appointment, please call your pharmacy*   Lab Work: None Ordered If you have labs (blood work) drawn today and your tests are completely normal, you will receive your results only by: MyChart Message (if you have MyChart) OR A paper copy in the mail If you have any lab test that is abnormal or we need to change your treatment, we will call you to review the results.   Testing/Procedures: Echocardiogram An echocardiogram is a test that uses sound waves (ultrasound) to produce images of the heart. Images from an echocardiogram can provide important information about: Heart size and shape. The size and thickness and movement of your heart's walls. Heart muscle function and strength. Heart valve function or if you have stenosis. Stenosis is when the heart valves are too narrow. If blood is flowing backward through the heart valves (regurgitation). A tumor or infectious growth around the heart valves. Areas of heart muscle that are not working well because of poor blood flow or injury from a heart attack. Aneurysm detection. An aneurysm is a weak or damaged part of an artery wall. The wall bulges out from the normal force of blood pumping through the body. Tell a health care provider about: Any allergies you have. All medicines you are taking, including vitamins, herbs, eye drops, creams, and over-the-counter medicines. Any blood disorders you have. Any surgeries you have had. Any medical conditions you have. Whether you are pregnant or may be pregnant. What are the risks? Generally, this is a safe test. However, problems may occur, including an allergic reaction to dye (contrast) that may be used during the test. What happens before the test? No specific preparation is needed. You may eat and drink  normally. What happens during the test?  You will take off your clothes from the waist up and put on a hospital gown. Electrodes or electrocardiogram (ECG)patches may be placed on your chest. The electrodes or patches are then connected to a device that monitors your heart rate and rhythm. You will lie down on a table for an ultrasound exam. A gel will be applied to your chest to help sound waves pass through your skin. A handheld device, called a transducer, will be pressed against your chest and moved over your heart. The transducer produces sound waves that travel to your heart and bounce back (or "echo" back) to the transducer. These sound waves will be captured in real-time and changed into images of your heart that can be viewed on a video monitor. The images will be recorded on a computer and reviewed by your health care provider. You may be asked to change positions or hold your breath for a short time. This makes it easier to get different views or better views of your heart. In some cases, you may receive contrast through an IV in one of your veins. This can improve the quality of the pictures from your heart. The procedure may vary among health care providers and hospitals. What can I expect after the test? You may return to your normal, everyday life, including diet, activities, and medicines, unless your health care provider tells you not to do that. Follow these instructions at home: It is up to you to get the results of your test. Ask your health care provider, or  the department that is doing the test, when your results will be ready. Keep all follow-up visits. This is important. Summary An echocardiogram is a test that uses sound waves (ultrasound) to produce images of the heart. Images from an echocardiogram can provide important information about the size and shape of your heart, heart muscle function, heart valve function, and other possible heart problems. You do not need to do  anything to prepare before this test. You may eat and drink normally. After the echocardiogram is completed, you may return to your normal, everyday life, unless your health care provider tells you not to do that. This information is not intended to replace advice given to you by your health care provider. Make sure you discuss any questions you have with your health care provider. Document Revised: 04/18/2021 Document Reviewed: 03/28/2020 Elsevier Patient Education  2023 Elsevier Inc.     Your physician has requested that you have a carotid duplex. This test is an ultrasound of the carotid arteries in your neck. It looks at blood flow through these arteries that supply the brain with blood. Allow one hour for this exam. There are no restrictions or special instructions.    Follow-Up: At [facility], you and your health needs are our priority.  As part of our continuing mission to provide you with exceptional heart care, we have created designated Provider Care Teams.  These Care Teams include your primary Cardiologist (physician) and Advanced Practice Providers (APPs -  Physician Assistants and Nurse Practitioners) who all work together to provide you with the care you need, when you need it.  We recommend signing up for the patient portal called "MyChart".  Sign up information is provided on this After Visit Summary.  MyChart is used to connect with patients for Virtual Visits (Telemedicine).  Patients are able to view lab/test results, encounter notes, upcoming appointments, etc.  Non-urgent messages can be sent to your provider as well.   To learn more about what you can do with MyChart, go to [email or web address].    Your next appointment:   1 month follow up

## 2023-11-27 NOTE — Assessment & Plan Note (Addendum)
 History of sustained VT episode in November 2022, subsequently no revascularizable lesions on cath in November 2022 with patent grafts. He was briefly on amiodarone unclear when he discontinued it between GI very 2023 to July 2023. Has preferred not to be on it.  Further reviewed the indication and the rationale for further drug recommendation and he is agreeable to take it until further evaluation for his ongoing symptoms is completed. Start amiodarone 200 mg twice daily for the next 4 weeks and subsequently 200 mg once daily.   Will review results from heart monitor Will review echocardiogram results Will review ultrasound carotid results.

## 2023-11-27 NOTE — Assessment & Plan Note (Signed)
 Has not had follow-up study in the lower 3 years. Will schedule for ultrasound carotids.

## 2023-11-27 NOTE — Progress Notes (Signed)
 Cardiology Consultation:    Date:  11/27/2023   ID:  Dan Holmes, DOB [birth year], MRN [id number]  PCP:  Street, Dan Coup, MD  Cardiologist:  Dan Corporal Bracy Pepper, MD   Referring MD: Street, Dan Holmes, *   No chief complaint on file.    ASSESSMENT AND PLAN:   Mr. Dan Holmes 89 year old male with history of CAD s/p CABG in 2005, had symptomatic sustained monomorphic VT in November 2022 was empirically started on amiodarone, echocardiogram at the time with LVEF 50 to 55% and hypokinetic basal inferior segments, mild MR, mild aortic insufficiency, underwent further evaluation with cardiac catheterization December 2022 noted severe native vessel disease with widely patent grafts LIMA-LAD and sequential grafts to OM1 and OM 2 and sequential graft to distal RCA and RPDA.  He self discontinued amiodarone at some point after January 2023 and symptoms have remained well-controlled on amiodarone.  More recently with ongoing symptoms of for like sensation in the chest and dizziness he is reached out to the office and after a nurse visit for twelve-lead EKG he was placed on heart monitor and here for follow-up visit today. He also has history of hypertension, hyperlipidemia, carotid artery stenosis, Psoriasis, gout, CKD stage III, diabetes, self discontinued statins and does not want to resume  Problem List Items Addressed This Visit     Essential hypertension   Well-controlled. Target below 140/90 mmHg. Continue carvedilol 12.5 mg twice daily.      Relevant Medications   amiodarone (PACERONE) 200 MG tablet   Carotid artery disease (HCC)   Has not had follow-up study in the lower 3 years. Will schedule for ultrasound carotids.      Relevant Medications   amiodarone (PACERONE) 200 MG tablet   Other Relevant Orders   VAS US CAROTID   Dyslipidemia   Does not want to be on statins or additional lipid-lowering therapy at this time. Will revisit this further at next follow-up  visit.      CAD (coronary artery disease) - Primary   CAD s/p CABG 2005 Last cardiac cath normal 2022 with patent grafts done in the setting of symptomatic sustained monomorphic VT.  Remains asymptomatic from cardiac standpoint in the form of angina or shortness of breath. Continue aspirin 81 mg once daily. Does not want to be on statins.  Continue carvedilol 12.5 mg twice daily.       Relevant Medications   amiodarone (PACERONE) 200 MG tablet   Ventricular tachycardia (HCC)   History of sustained VT episode in November 2022, subsequently no revascularizable lesions on cath in November 2022 with patent grafts. He was briefly on amiodarone unclear when he discontinued it between GI very 2023 to July 2023. Has preferred not to be on it.  Further reviewed the indication and the rationale for further drug recommendation and he is agreeable to take it until further evaluation for his ongoing symptoms is completed. Start amiodarone 200 mg twice daily for the next 4 weeks and subsequently 200 mg once daily.   Will review results from heart monitor Will review echocardiogram results Will review ultrasound carotid results.        Relevant Medications   amiodarone (PACERONE) 200 MG tablet   Near syncope   Does have significant cardiovascular risk factors and prior history of sustained VT that was symptomatic and he has not been on any medications other than beta-blockers. Heart monitor currently on 14-day study pending.  Will review results once available.  Will reassess with transthoracic echocardiogram for  significant LV functional changes or new wall motion abnormalities. Recent electrolyte levels per patient were normal. Advised him to start taking magnesium over-the-counter supplement that he purchased.  Will assess ultrasound carotids that are being ordered.  Advised him to keep himself well-hydrated keep activity to mild to moderate exertion and take rest at the onset of  symptoms.       Relevant Medications   amiodarone (PACERONE) 200 MG tablet   Mild mitral regurgitation   Follow-up with echocardiogram since it has been over 2 years since prior evaluation.       Relevant Medications   amiodarone (PACERONE) 200 MG tablet   Other Relevant Orders   ECHOCARDIOGRAM COMPLETE   Mild aortic insufficiency   Follow-up echocardiogram.      Relevant Medications   amiodarone (PACERONE) 200 MG tablet   Other Relevant Orders   ECHOCARDIOGRAM COMPLETE   Return to clinic tentatively in 1 month to review results.   History of Present Illness:    Dan Holmes is a 89 y.o. male who is being seen today for follow-up PCP is Street, [place], *. Last visit with [place] in the office was 12-18-2022 with Dr. Dulce Holmes.  Pleasant gentleman here for the visit accompanied by his significant other and they are planning on wedding May 31.  Currently he lives by himself and manages his own medications.  Has history of CAD s/p CABG in 2005 last cardiac cath December 2022 with severe native vessel disease and widely patent grafts LIMA-LAD, sequential SVG to OM1 and OM 2 and sequential SVG to distal RCA and RPDA and noted to have normal LVEDP, and echocardiogram November 2022 at [facility] noted low normal LVEF 50 to 55%, grade 1 diastolic dysfunction, hypokinetic basal inferolateral segments, moderately dilated left atrium, mild aortic insufficiency, mild mitral insufficiency, symptomatic sustained monomorphic VT in November 2022 was briefly on amiodarone until January 2023 and self discontinued, hypertension, dyslipidemia, self discontinued statins and does not prefer to be on that., carotid artery stenosis, Psoriasis, gout, CKD stage III, diabetes.  He reached out to our office April 3 had a nurse visit for fluttering in the chest and dizziness.  EKG done in the clinic showed sinus rhythm heart rate 66/min, mildly prolonged PR interval 212 ms, rightward axis of QRS  with normal QTc 427 ms.  He was scheduled for heart monitor 14 days study, has another 7 days to have it on.  Here today he mentions his symptoms have been typically of dizziness and fast heartbeat sensation which tend to occur more often when he is exerting himself and associated with lightheadedness.  Mentions that these have been going on more often over the last 3 to 4 weeks.  Denies any syncopal episodes or falls.  Mentions has been taking his medications as prescribed. However tells me he discontinued statins and has been taking 3 ounces of wine per night to help with his cholesterol levels. He had self discontinued amiodarone sometime between January through July 2023.   Denies any blood in urine or stools. Compliant with his other medications aspirin, carvedilol, methotrexate, metformin, folic acid, allopurinol.  Recent blood work from 11-06-2023 with total cholesterol 137, HDL 40, triglycerides 65, LDL value not available. Creatinine 1.2 TSH from 05-19-2023 with normal 1.479.     Past Medical History:  Diagnosis Date   Abdominal pain 01/27/2016   Abnormal carotid ultrasound 04/23/2018   Abnormal LFTs    Acute kidney injury (HCC) 11/26/2023   Arthritis  ASCVD (arteriosclerotic cardiovascular disease)    BPH (benign prostatic hyperplasia) 11/26/2023   CAD (coronary artery disease)    January, 2012, nuclear, EF 61%, normal   Carotid artery disease (HCC)    Doppler, August, 2011, 40-59% RIC A., 0-39% LICA, followup one year   Carpal tunnel syndrome    Cholecystitis 11/13/2016   Chronic anemia 11/26/2023   Chronic gout without tophus 06/19/2016   Coronary artery disease involving native coronary artery of native heart 06/28/2015   January, 2012, nuclear, EF 61%, normal   Coronary artery disease involving native coronary artery of native heart without angina pectoris 06/28/2015   January, 2012, nuclear, EF 61%, normal     Cough due to ACE inhibitor 12/16/2014   The  patient's case was changed to an ARB in March, 2016.    DDD (degenerative disc disease), cervical 06/19/2016   DDD (degenerative disc disease), lumbar 06/19/2016   Spinal Fusion   Diabetes mellitus    Diabetes mellitus (HCC)    IMO SNOMED Dx Update Oct 2024     Dyslipidemia 05/28/2013   Joint pains that may have been related to Crestor.    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 Maren Beach -- LIMA-LAD, SeqSVG-OM1-OM2, SeqSVG-dRCA-PDA   LEFT HEART CATH AND CORONARY ANGIOGRAPHY  02/13/2004   (Dr. Dickie La): p-mLAD long 80%, LCx branches into OM1& OM2 (~AVG occluded) - OM1 ~80%, LCx-OM2 ~80%, Ost-prox RCA 90%, Tandem 90% ostial & 80% proximal rPDA.  EF ~60% with inferior HK.   LEFT HEART CATH AND CORS/GRAFTS ANGIOGRAPHY N/A 07/27/2021   Procedure: LEFT HEART CATH AND CORS/GRAFTS ANGIOGRAPHY;  Surgeon: Marykay Lex, MD;  Location[facility] INVASIVE CV LAB;  Service: Cardiovascular;  Laterality: N/A;   SPINAL FUSION      Current Medications: Current Meds  Medication Sig   amiodarone (PACERONE) 200 MG tablet Take 1 tablet (200 mg total) by mouth as directed. Take Amiodarone 100  mg twice a day for 4 weeks. Then start Amiodarone 200 mg once a day   aspirin 81 MG tablet Take 81 mg by mouth every morning.    carvedilol (COREG) 25 MG tablet Take 12.5 mg by mouth 2 (two) times daily with a meal.   Cholecalciferol (VITAMIN D3) 5000 UNITS CAPS Take 5,000 Units by mouth every morning.   finasteride (PROSCAR) 5 MG tablet Take 5 mg by mouth daily.   folic acid (FOLVITE) 1 MG tablet Take 1 tablet (1 mg total) by mouth every evening.   magnesium oxide (MAG-OX) 400 (240 Mg) MG tablet Take 400 mg by mouth daily.   metFORMIN (GLUCOPHAGE) 500 MG tablet Take 500  mg by mouth daily.   methotrexate (RHEUMATREX) 2.5 MG tablet Take 10 mg by mouth once a week. Caution:Chemotherapy. Protect from light. Take 4 once a week   Multiple Vitamins-Minerals (PRESERVISION AREDS 2 PO) Take 1 tablet by mouth 2 (two) times daily.   tamsulosin (FLOMAX) 0.4 MG CAPS capsule Take 0.4 mg by mouth daily.     Allergies:   Sulfa antibiotics   Social History   Socioeconomic History   Marital status: Married    Spouse name: Not on file   Number of children: Not on file   Years of education: Not on file   Highest education level: Not on file  Occupational History   Not on file  Tobacco Use   Smoking status: Former    Current packs/day: 0.00    Average packs/day: 0.5 packs/day for 10.0 years (5.0 ttl pk-yrs)    Types: Cigarettes    Start date: 03/22/1955    Quit date: 03/21/1965    Years since quitting: 58.7   Smokeless tobacco: Never  Vaping Use   Vaping status: Never Used  Substance and Sexual Activity   Alcohol use: No    Alcohol/week: 0.0 standard drinks of alcohol   Drug use: Never   Sexual activity: Not on file  Other Topics Concern   Not on file  Social History Narrative   Level of education: High school    Employment: retired, [employer] maintenance       Social Drivers of Corporate investment banker Strain: Not on file  Food Insecurity: Not on file  Transportation Needs: Not on file   Physical Activity: Not on file  Stress: Not on file  Social Connections: Not on file     Family History: The patient's family history includes CVA in his mother; Heart attack in his father; Heart disease in his brother; Stroke in his mother. ROS:   Please see the history of present illness.    All 14 point review of systems negative except as described per history of present illness.  EKGs/Labs/Other Studies Reviewed:    The following studies were reviewed today:   EKG:       Recent Labs: No results found for requested labs within last 365 days.  Recent Lipid Panel    Component Value Date/Time   CHOL 93 01/28/2016 0453   TRIG 67 01/28/2016 0453   HDL 33 (L) 01/28/2016 0453   CHOLHDL 2.8 01/28/2016 0453   VLDL 13 01/28/2016 0453   LDLCALC 47 01/28/2016 0453    Physical Exam:    VS:  BP 138/72   Pulse 64   Ht 5\' 10"  (1.778 m)   Wt 176 lb 6.4 oz (80 kg)   SpO2 98%   BMI 25.31 kg/m     Wt Readings from Last 3 Encounters:  11/27/23 176 lb 6.4 oz (80 kg)  11/20/23 172 lb 3.2 oz (78.1 kg)  12/18/22 163 lb (73.9 kg)     GENERAL:  Well nourished, well developed in no acute distress NECK: No JVD; No carotid bruits CARDIAC: RRR, S1 and S2 present, no murmurs, no rubs, no gallops CHEST:  Clear to auscultation without rales, wheezing or rhonchi  Extremities: No pitting pedal edema. Pulses bilaterally symmetric with radial 2+ and dorsalis pedis 2+ NEUROLOGIC:  Alert and oriented x 3  Medication Adjustments/Labs and Tests Ordered: Current medicines are reviewed at length with the patient today.  Concerns regarding medicines are outlined above.  Orders Placed This Encounter  Procedures  ECHOCARDIOGRAM COMPLETE   VAS US CAROTID   Meds ordered this encounter  Medications   amiodarone (PACERONE) 200 MG tablet    Sig: Take 1 tablet (200 mg total) by mouth as directed. Take Amiodarone 100 mg twice a day for 4 weeks. Then start Amiodarone 200 mg once a day    Dispense:   180 tablet    Refill:  3    Signed, Marvell Tamer reddy Ebrahim Deremer, MD, MPH, [facility]. 11/27/2023 11:43 AM    [facility]

## 2023-11-27 NOTE — Assessment & Plan Note (Signed)
 Does not want to be on statins or additional lipid-lowering therapy at this time. Will revisit this further at next follow-up visit.

## 2023-11-27 NOTE — Assessment & Plan Note (Signed)
 CAD s/p CABG 2005 Last cardiac cath normal 2022 with patent grafts done in the setting of symptomatic sustained monomorphic VT.  Remains asymptomatic from cardiac standpoint in the form of angina or shortness of breath. Continue aspirin 81 mg once daily. Does not want to be on statins.  Continue carvedilol 12.5 mg twice daily.

## 2023-11-27 NOTE — Assessment & Plan Note (Signed)
 Well-controlled. Target below 140/90 mmHg. Continue carvedilol 12.5 mg twice daily.

## 2023-11-27 NOTE — Assessment & Plan Note (Signed)
 Follow-up with echocardiogram since it has been over 2 years since prior evaluation.

## 2023-12-13 DIAGNOSIS — R42 Dizziness and giddiness: Secondary | ICD-10-CM | POA: Diagnosis not present

## 2023-12-25 ENCOUNTER — Ambulatory Visit

## 2023-12-25 ENCOUNTER — Ambulatory Visit (INDEPENDENT_AMBULATORY_CARE_PROVIDER_SITE_OTHER)

## 2023-12-25 DIAGNOSIS — I351 Nonrheumatic aortic (valve) insufficiency: Secondary | ICD-10-CM

## 2023-12-25 DIAGNOSIS — I340 Nonrheumatic mitral (valve) insufficiency: Secondary | ICD-10-CM | POA: Diagnosis not present

## 2023-12-25 DIAGNOSIS — I6523 Occlusion and stenosis of bilateral carotid arteries: Secondary | ICD-10-CM

## 2023-12-25 DIAGNOSIS — I779 Disorder of arteries and arterioles, unspecified: Secondary | ICD-10-CM

## 2023-12-25 LAB — ECHOCARDIOGRAM COMPLETE
P 1/2 time: 597 ms
S' Lateral: 3.6 cm

## 2023-12-26 ENCOUNTER — Other Ambulatory Visit: Payer: Self-pay

## 2023-12-29 ENCOUNTER — Ambulatory Visit

## 2023-12-29 VITALS — BP 130/62 | HR 63 | Ht 70.0 in | Wt 175.8 lb

## 2023-12-29 DIAGNOSIS — I779 Disorder of arteries and arterioles, unspecified: Secondary | ICD-10-CM | POA: Diagnosis not present

## 2023-12-29 DIAGNOSIS — R42 Dizziness and giddiness: Secondary | ICD-10-CM | POA: Diagnosis not present

## 2023-12-29 DIAGNOSIS — I25118 Atherosclerotic heart disease of native coronary artery with other forms of angina pectoris: Secondary | ICD-10-CM

## 2023-12-29 DIAGNOSIS — E785 Hyperlipidemia, unspecified: Secondary | ICD-10-CM | POA: Diagnosis not present

## 2023-12-29 DIAGNOSIS — I472 Ventricular tachycardia, unspecified: Secondary | ICD-10-CM

## 2023-12-29 MED ORDER — ROSUVASTATIN CALCIUM 5 MG PO TABS: 5.0000 mg | ORAL_TABLET | Freq: Every day | ORAL | 3 refills | Status: DC

## 2023-12-29 MED ORDER — AMIODARONE HCL 200 MG PO TABS: 100.0000 mg | ORAL_TABLET | Freq: Every day | ORAL | 3 refills | Status: DC

## 2023-12-29 NOTE — Assessment & Plan Note (Addendum)
 History of sustained VT episode over 2022, subsequent Showed patent grafts without any revascularization required. Was previously on amiodarone  and tolerated well discontinued sometime around July 2023 for unclear reasons.  Now restarted in the setting of symptoms of flutter like sensation associated with isolated PVCs, occasional ventricular ectopy burden on event monitor and short run of nonsustained VT up to 10 beats.  Tolerating amiodarone  well. Continue amiodarone  100 mg once daily. Follow-up blood work with thyroid  panel and CMP prior to next visit in 6 months. He already had history of bilateral cataract surgeries done. Will consider follow-up chest x-ray imaging in 1 year for amiodarone  monitoring.

## 2023-12-29 NOTE — Assessment & Plan Note (Signed)
 Results from ultrasound carotid currently pending.  Will review once available. Continue risk reduction with aspirin  81 mg once daily and statin which is being started Crestor 5 mg 2 times a week.

## 2023-12-29 NOTE — Assessment & Plan Note (Addendum)
 S/p CABG in 2005. Last cardiac cath December 2022 done in setting of sustained monomorphic VT with severe native vessel disease with patent grafts LIMA-LAD, sequential grafts to OM1 and OM 2 and sequential grafts to distal RCA and RPDA.  Continue with aspirin  81 mg once daily. Revisited discussion about statins for risk reduction and he is agreeable, start Crestor 5 mg 2 times a week.  He requests risk about using medication such as Viagra or Cialis.  From cardiac standpoint he is currently not on treats, has no symptoms of angina, has good biventricular function without any major valve abnormalities. Okay to use phosphodiesterase inhibitor such as Viagra or Cialis as needed.  Cautioned about potential side effects. Advised to review this further with his PCP regarding use of Viagra or Cialis and further side effects to watch out for.  From cardiac standpoint okay to prescribe these medications.

## 2023-12-29 NOTE — Addendum Note (Signed)
 Addended by: Roena Clark R on: 12/29/2023 09:05 AM   Modules accepted: Orders

## 2023-12-29 NOTE — Progress Notes (Signed)
 Cardiology Consultation:    Date:  12/29/2023   ID:  Dan Holmes, DOB [birth year], MRN [id number]  PCP:  Street, Renford Cartwright, MD  Cardiologist:  Dan Evans Sye Schroepfer, MD   Referring MD: Dan Holmes, *   No chief complaint on file.    ASSESSMENT AND PLAN:   Mr Dan Holmes 89 year old male with history of CAD s/p CABG in 2005 with symptomatic sustained monomorphic VT in November 2022 with echocardiogram at the time showing LVEF 50 to 55% and basal inferior segment hypokinesis, cardiac catheterization December 2022 noted severe native vessel disease with patent grafts, was empirically started on amiodarone  at the time which he is later self discontinued at some point after January 2023 and prior to last follow-up visit with us .  And recently on follow-up for a flutter like sensation in the chest correlating with isolated PVCs he subsequently had event monitor April 2025 that confirmed occasional PVC burden 2.7%, occasional supraventricular ectopic beats 1.4%, short run of NSVT up to 10 beats and patient triggered events correlating with isolated ventricular and supraventricular ectopic beats.  He is echocardiogram from May 2025 noted normal biventricular function with moderate LVH and EF 60 to 65% and mild TR was noted with trace aortic insufficiency and MR. Ultrasound carotids is complete care with report currently pending. Also has history of hypertension, hyperlipidemia, carotid artery disease, psoriasis, gout, CKD stage III, diabetes.  Doing well now that he has been started on amiodarone .  Here for follow-up visit.  Problem List Items Addressed This Visit     Carotid artery disease (HCC)   Results from ultrasound carotid currently pending.  Will review once available. Continue risk reduction with aspirin  81 mg once daily and statin which is being started Crestor 5 mg 2 times a week.       Relevant Medications   amiodarone  (PACERONE ) 200 MG tablet   rosuvastatin  (CRESTOR) 5 MG tablet   Dyslipidemia   Relevant Medications   rosuvastatin (CRESTOR) 5 MG tablet   Other Relevant Orders   Comprehensive metabolic panel with GFR   Lipid panel   TSH   T4, free   T3, free   CAD (coronary artery disease) - Primary   S/p CABG in 2005. Last cardiac cath December 2022 done in setting of sustained monomorphic VT with severe native vessel disease with patent grafts LIMA-LAD, sequential grafts to OM1 and OM 2 and sequential grafts to distal RCA and RPDA.  Continue with aspirin  81 mg once daily. Revisited discussion about statins for risk reduction and he is agreeable, start Crestor 5 mg 2 times a week.  He requests risk about using medication such as Viagra or Cialis.  From cardiac standpoint he is currently not on treats, has no symptoms of angina, has good biventricular function without any major valve abnormalities. Okay to use phosphodiesterase inhibitor such as Viagra or Cialis as needed.  Cautioned about potential side effects. Advised to review this further with his PCP regarding use of Viagra or Cialis and further side effects to watch out for.  From cardiac standpoint okay to prescribe these medications.      Relevant Medications   amiodarone  (PACERONE ) 200 MG tablet   rosuvastatin (CRESTOR) 5 MG tablet   Other Relevant Orders   EKG 12-Lead (Completed)   Comprehensive metabolic panel with GFR   Lipid panel   TSH   T4, free   T3, free   Ventricular tachycardia (HCC)   History of sustained VT episode over 2022,  subsequent Showed patent grafts without any revascularization required. Was previously on amiodarone  and tolerated well discontinued sometime around July 2023 for unclear reasons.  Now restarted in the setting of symptoms of flutter like sensation associated with isolated PVCs, occasional ventricular ectopy burden on event monitor and short run of nonsustained VT up to 10 beats.  Tolerating amiodarone  well. Continue amiodarone  100 mg  once daily. Follow-up blood work with thyroid  panel and CMP prior to next visit in 6 months. He already had history of bilateral cataract surgeries done. Will consider follow-up chest x-ray imaging in 1 year for amiodarone  monitoring.      Relevant Medications   amiodarone  (PACERONE ) 200 MG tablet   rosuvastatin (CRESTOR) 5 MG tablet   Other Relevant Orders   Comprehensive metabolic panel with GFR   Lipid panel   TSH   T4, free   T3, free      History of Present Illness:    Dan Holmes is a 89 y.o. male who is being seen today for follow-up visit. PCP is Dan Holmes, *. Last visit with us  in the office was 11-27-2023.  Has history of CAD s/p CABG in 2005, symptomatic sustained monomorphic VT in November 2022 empirically started on amiodarone  at the time, echocardiogram with LVEF 50 to 55% at that time with hypokinetic basal inferior segments, mild MR, mild aortic insufficiency, subsequently cardiac catheterization December 2022 noted severe native vessel disease with patent LIMA-LAD and sequential grafts to OM1 and OM 2 and sequential grafts to distal RCA and RPDA.  Self discontinued amiodarone  around January 2023.  Later with symptoms of flutter like sensation in the chest and dizziness event monitor was placed prior to the last visit.  He also has history of hypertension, hyperlipidemia, prefers not to be on statins, carotid artery disease, psoriasis, gout, CKD stage III, diabetes. Amiodarone  was restarted at last visit and follow-up echocardiogram, carotid ultrasound was requested.  Carotid ultrasound exam completed, results not yet available.  Echocardiogram from 12/25/2023 noted normal biventricular function with LVEF 60 to 65%, moderate LVH with grade 2 diastolic dysfunction.  Normal RV size.  No significant evidence of mitral regurgitation.  Trace aortic insufficiency.  Mild tricuspid regurgitation.  Event monitor for 14 days from 11/20/2023 through 12/04/2023 noted  predominantly sinus rhythm with average heart rate 74/min [ranging from 56 to 119 bpm].  Occasional ventricular ectopy burden 2.3%, occasional supraventricular ectopy burden 1.4%.  3 short runs of nonsustained ventricular tachycardia noted with longest episode lasting 10 beats.  2 short runs of supraventricular tachycardia noted with the longest lasting 4 beats.  8 triggered events were reported mostly correlated with sinus rhythm normal heart rates with isolated ventricular and supraventricular ectopic beats.  First-degree AV block noted.  Reviewed the tracings myself.  EKG in the clinic today shows sinus rhythm heart rate 63/min, PR interval mildly prolonged 214 ms, QRS with rightward axis 84 ms duration.  Anteroseptal Q waves.  Lives by himself and manages his own medications.  Is planning on getting married in May to his significant other.  Here today mentions has been doing well.  Other than feeling lightheaded if he has not had anything to eat in some time, he remains asymptomatic.  Denies any further symptoms of palpitations since he has started taking low-dose amiodarone . Tolerating medications well.  Mentions he is amenable to take statins couple times a week.  Denies any side effects to statins in the past.     Past Medical History:  Diagnosis  Date   Abdominal pain 01/27/2016   Abnormal carotid ultrasound 04/23/2018   Abnormal LFTs    Acute kidney injury (HCC) 11/26/2023   Arthritis    ASCVD (arteriosclerotic cardiovascular disease)    BPH (benign prostatic hyperplasia) 11/26/2023   CAD (coronary artery disease)    January, 2012, nuclear, EF 61%, normal   Carotid artery disease (HCC)    Doppler, August, 2011, 40-59% RIC A., 0-39% LICA, followup one year   Carpal tunnel syndrome    Cholecystitis 11/13/2016   Chronic anemia 11/26/2023   Chronic gout without tophus 06/19/2016   Coronary artery disease involving native coronary artery of native heart 06/28/2015   January, 2012,  nuclear, EF 61%, normal   Coronary artery disease involving native coronary artery of native heart without angina pectoris 06/28/2015   January, 2012, nuclear, EF 61%, normal     Cough due to ACE inhibitor 12/16/2014   The patient's case was changed to an ARB in March, 2016.    DDD (degenerative disc disease), cervical 06/19/2016   DDD (degenerative disc disease), lumbar 06/19/2016   Spinal Fusion   Diabetes mellitus (HCC)    IMO SNOMED Dx Update Oct 2024     Dyslipidemia 05/28/2013   Joint pains that may have been related to Crestor.    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   Mild aortic insufficiency 11/27/2023   Mild mitral regurgitation 11/27/2023   Near syncope 07/27/2021   Old MI (myocardial infarction) 02/12/2018   Orthostatic hypotension 11/26/2023   Osteoporosis 06/19/2016   Pain 02/2010   right neck, right shoulder, right wrist, right leg-not cardiac   Pancreatitis 01/27/2016   Pancreatitis, acute    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 Jeb Miner -- LIMA-LAD, SeqSVG-OM1-OM2, SeqSVG-dRCA-PDA   LEFT HEART CATH AND CORONARY ANGIOGRAPHY  02/13/2004   (Dr. Robinette Chou): p-mLAD long 80%, LCx branches into OM1& OM2 (~AVG occluded) - OM1 ~80%, LCx-OM2 ~80%, Ost-prox RCA 90%, Tandem 90% ostial & 80% proximal rPDA.  EF ~60% with inferior HK.   LEFT HEART CATH AND CORS/GRAFTS ANGIOGRAPHY N/A 07/27/2021   Procedure: LEFT HEART CATH AND  CORS/GRAFTS ANGIOGRAPHY;  Surgeon: Arleen Lacer, MD;  Location: [facility] INVASIVE CV LAB;  Service: Cardiovascular;  Laterality: N/A;   SPINAL FUSION      Current Medications: Current Meds  Medication Sig   allopurinol  (ZYLOPRIM ) 300 MG tablet Take 300 mg by mouth daily.   amiodarone  (PACERONE ) 200 MG tablet Take 100 mg by mouth daily.   aspirin  81 MG tablet Take 81 mg by mouth every morning.    carvedilol  (COREG ) 25 MG tablet Take 25 mg by mouth 2 (two) times daily with a meal.   Cholecalciferol (VITAMIN D3) 5000 UNITS CAPS Take 5,000 Units by mouth every morning.   finasteride  (PROSCAR ) 5 MG tablet Take 5 mg by mouth daily.   folic acid  (FOLVITE ) 1 MG tablet Take 1  tablet (1 mg total) by mouth every evening.   magnesium oxide (MAG-OX) 400 (240 Mg) MG tablet Take 400 mg by mouth daily.   metFORMIN (GLUCOPHAGE) 500 MG tablet Take 500 mg by mouth daily.   methotrexate  (RHEUMATREX) 2.5 MG tablet Take 10 mg by mouth once a week. Caution:Chemotherapy. Protect from light. Take 4 once a week   Multiple Vitamins-Minerals (PRESERVISION AREDS 2 PO) Take 1 tablet by mouth 2 (two) times daily.   rosuvastatin (CRESTOR) 5 MG tablet Take 1 tablet (5 mg total) by mouth daily.   tamsulosin (FLOMAX) 0.4 MG CAPS capsule Take 0.4 mg by mouth daily.     Allergies:   Sulfa antibiotics   Social History   Socioeconomic History   Marital status: Married    Spouse name: Not on file   Number of children: Not on file   Years of education: Not on file   Highest education level: Not on file  Occupational History   Not on file  Tobacco Use   Smoking status: Former    Current packs/day: 0.00    Average packs/day: 0.5 packs/day for 10.0 years (5.0 ttl pk-yrs)    Types: Cigarettes    Start date: 03/22/1955    Quit date: 03/21/1965    Years since quitting: 58.8   Smokeless tobacco: Never  Vaping Use   Vaping status: Never Used  Substance and Sexual Activity   Alcohol use: No    Alcohol/week: 0.0 standard  drinks of alcohol   Drug use: Never   Sexual activity: Not on file  Other Topics Concern   Not on file  Social History Narrative   Level of education: High school    Employment: retired, [employer] maintenance       Social Drivers of Corporate investment banker Strain: Not on file  Food Insecurity: Not on file  Transportation Needs: Not on file  Physical Activity: Not on file  Stress: Not on file  Social Connections: Not on file     Family History: The patient's family history includes CVA in his mother; Heart attack in his father; Heart disease in his brother; Stroke in his mother. ROS:   Please see the history of present illness.    All 14 point review of systems negative except as described per history of present illness.  EKGs/Labs/Other Studies Reviewed:    The following studies were reviewed today:   EKG:  EKG Interpretation Date/Time:  Monday Dec 29 2023 08:15:54 EDT Ventricular Rate:  63 PR Interval:  214 QRS Duration:  84 QT Interval:  430 QTC Calculation: 440 R Axis:   97  Text Interpretation: Sinus rhythm with 1st degree A-V block Rightward axis Possible Anterior infarct , age undetermined Abnormal ECG When compared with ECG of 20-Nov-2023 11:03, No significant change was found Confirmed by Bertha Broad reddy [phone]) on 12/29/2023 8:40:19 AM    Recent Labs: No results found for requested labs within last 365 days.  Recent Lipid Panel    Component Value Date/Time   CHOL 93 01/28/2016 0453   TRIG 67 01/28/2016 0453   HDL 33 (L) 01/28/2016 0453   CHOLHDL 2.8 01/28/2016 0453   VLDL 13 01/28/2016 0453   LDLCALC 47 01/28/2016 0453    Physical Exam:    VS:  BP 130/62   Pulse 63   Ht 5\' 10"  (1.778 m)   Wt 175 lb 12.8 oz (79.7 kg)   SpO2 97%   BMI 25.22 kg/m     Wt Readings  from Last 3 Encounters:  12/29/23 175 lb 12.8 oz (79.7 kg)  11/27/23 176 lb 6.4 oz (80 kg)  11/20/23 172 lb 3.2 oz (78.1 kg)     GENERAL:  Well nourished, well developed in no  acute distress NECK: No JVD; No carotid bruits CARDIAC: RRR, S1 and S2 present, no murmurs, no rubs, no gallops CHEST:  Clear to auscultation without rales, wheezing or rhonchi  Extremities: No pitting pedal edema. Pulses bilaterally symmetric with radial 2+ and dorsalis pedis 2+ NEUROLOGIC:  Alert and oriented x 3  Medication Adjustments/Labs and Tests Ordered: Current medicines are reviewed at length with the patient today.  Concerns regarding medicines are outlined above.  Orders Placed This Encounter  Procedures   Comprehensive metabolic panel with GFR   Lipid panel   TSH   T4, free   T3, free   EKG 12-Lead   Meds ordered this encounter  Medications   rosuvastatin (CRESTOR) 5 MG tablet    Sig: Take 1 tablet (5 mg total) by mouth daily.    Dispense:  8 tablet    Refill:  3    Signed, Zalmen Wrightsman reddy Tarah Buboltz, MD, MPH, [facility]. 12/29/2023 8:59 AM    [facility]

## 2023-12-29 NOTE — Patient Instructions (Signed)
 Medication Instructions:  Your physician has recommended you make the following change in your medication:  Start Crestor 5 mg two times a week in the evening  *If you need a refill on your cardiac medications before your next appointment, please call your pharmacy*  Lab Work: Your physician recommends that you return for lab work in: 6 months, a few days before follow up with Dr. Ronell Coe for a CMP, Fasting Lipid Panel and a thyroid  panel Lab opens at 8am. You DO NOT NEED an appointment. Best time to come is between 8am and 11:45 and between 1:30 and 4:30. If you have been asked to fast for your blood work please have nothing to eat or drink after midnight. You may have water.   If you have labs (blood work) drawn today and your tests are completely normal, you will receive your results only by: MyChart Message (if you have MyChart) OR A paper copy in the mail If you have any lab test that is abnormal or we need to change your treatment, we will call you to review the results.  Testing/Procedures: NONE  Follow-Up: At [facility], you and your health needs are our priority.  As part of our continuing mission to provide you with exceptional heart care, our providers are all part of one team.  This team includes your primary Cardiologist (physician) and Advanced Practice Providers or APPs (Physician Assistants and Nurse Practitioners) who all work together to provide you with the care you need, when you need it.  Your next appointment:   6 month(s)  Provider:   Bertha Broad, MD    We recommend signing up for the patient portal called "MyChart".  Sign up information is provided on this After Visit Summary.  MyChart is used to connect with patients for Virtual Visits (Telemedicine).  Patients are able to view lab/test results, encounter notes, upcoming appointments, etc.  Non-urgent messages can be sent to your provider as well.   To learn more about what you can do with  MyChart, go to [email or web address].   Other Instructions

## 2023-12-30 ENCOUNTER — Ambulatory Visit: Payer: Self-pay

## 2023-12-30 DIAGNOSIS — I6502 Occlusion and stenosis of left vertebral artery: Secondary | ICD-10-CM

## 2023-12-30 NOTE — Progress Notes (Signed)
 Please inform him the results show mild carotid artery stenosis. There is no flow observed in the left vertebral artery.  Rest of the study was unremarkable. Asymptomatic. In this context with abnormal findings pertaining to left vertebral artery flow, please refer him to be evaluated by vascular surgeon, nonurgent.

## 2023-12-31 NOTE — Telephone Encounter (Signed)
 Left vm to return call.

## 2023-12-31 NOTE — Telephone Encounter (Signed)
-----   Message from Dan Holmes sent at 12/30/2023  6:05 PM EDT ----- Please inform him the results show mild carotid artery stenosis. There is no flow observed in the left vertebral artery.  Rest of the study was unremarkable. Asymptomatic. In this context with abnormal findings pertaining to left vertebral artery flow, please refer him to be evaluated by vascular surgeon, nonurgent.

## 2024-01-01 NOTE — Addendum Note (Signed)
 Addended by: Einar Grave on: 01/01/2024 01:45 PM   Modules accepted: Orders

## 2024-01-01 NOTE — Telephone Encounter (Signed)
 Results reviewed with pt as per Dr. Madireddy's note.  Pt verbalized understanding and had no additional questions. Routed to PCP.  Referral placed

## 2024-01-01 NOTE — Telephone Encounter (Signed)
-----   Message from Daymon Evans Madireddy sent at 12/30/2023  6:05 PM EDT ----- Please inform him the results show mild carotid artery stenosis. There is no flow observed in the left vertebral artery.  Rest of the study was unremarkable. Asymptomatic. In this context with abnormal findings pertaining to left vertebral artery flow, please refer him to be evaluated by vascular surgeon, nonurgent.

## 2024-01-07 ENCOUNTER — Ambulatory Visit: Payer: Self-pay | Admitting: Cardiology

## 2024-01-08 NOTE — Progress Notes (Signed)
 Patient ID: Dan Holmes, male   DOB: July 05, 1932, 89 y.o.   MRN: [id number]  Reason for Consult: New Patient (Initial Visit)   Referred by Street, Renford Cartwright, *  Subjective:  HPI Dan Holmes is a 89 y.o. male presenting for an incidental finding of a left vertebral artery occlusion.  He underwent carotid duplex for surveillance as he was noted to have 40 to 59% stenosis back in 2021 and recently got another duplex for surveillance which demonstrated essentially no significant stenosis but an incidental finding of left vertebral artery occlusion was noted.  He denies any exertional left arm pain or hand pain.  He does have some chronic dizziness but overall is not too debilitating.  Past Medical History:  Diagnosis Date   Abdominal pain 01/27/2016   Abnormal carotid ultrasound 04/23/2018   Abnormal LFTs    Acute kidney injury (HCC) 11/26/2023   Arthritis    ASCVD (arteriosclerotic cardiovascular disease)    BPH (benign prostatic hyperplasia) 11/26/2023   CAD (coronary artery disease)    January, 2012, nuclear, EF 61%, normal   Carotid artery disease (HCC)    Doppler, August, 2011, 40-59% RIC A., 0-39% LICA, followup one year   Carpal tunnel syndrome    Cholecystitis 11/13/2016   Chronic anemia 11/26/2023   Chronic gout without tophus 06/19/2016   Coronary artery disease involving native coronary artery of native heart 06/28/2015   January, 2012, nuclear, EF 61%, normal   Coronary artery disease involving native coronary artery of native heart without angina pectoris 06/28/2015   January, 2012, nuclear, EF 61%, normal     Cough due to ACE inhibitor 12/16/2014   The patient's case was changed to an ARB in March, 2016.    DDD (degenerative disc disease), cervical 06/19/2016   DDD (degenerative disc disease), lumbar 06/19/2016   Spinal Fusion   Diabetes mellitus (HCC)    IMO SNOMED Dx Update Oct 2024     Dyslipidemia 05/28/2013   Joint pains that may have been  related to Crestor .    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   Mild aortic insufficiency 11/27/2023   Mild mitral regurgitation 11/27/2023   Near syncope 07/27/2021   Old MI (myocardial infarction) 02/12/2018   Orthostatic hypotension 11/26/2023   Osteoporosis 06/19/2016   Pain 02/2010   right neck, right shoulder, right wrist, right leg-not cardiac   Pancreatitis 01/27/2016   Pancreatitis, acute    [place]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 Jeb Miner -- LIMA-LAD, SeqSVG-OM1-OM2, SeqSVG-dRCA-PDA   LEFT HEART CATH AND CORONARY ANGIOGRAPHY  02/13/2004   (Dr. Robinette Chou): p-mLAD long 80%, LCx branches into OM1& OM2 (~AVG occluded) - OM1 ~80%, LCx-OM2 ~80%, Ost-prox RCA 90%, Tandem 90% ostial &  80% proximal rPDA.  EF ~60% with inferior HK.   LEFT HEART CATH AND CORS/GRAFTS ANGIOGRAPHY N/A 07/27/2021   Procedure: LEFT HEART CATH AND CORS/GRAFTS ANGIOGRAPHY;  Surgeon: Arleen Lacer, MD;  Location[facility] INVASIVE CV LAB;  Service: Cardiovascular;  Laterality: N/A;   SPINAL FUSION      Short Social History:  Social History   Tobacco Use   Smoking status: Former    Current packs/day: 0.00    Average packs/day: 0.5 packs/day for 10.0 years (5.0 ttl pk-yrs)    Types: Cigarettes    Start date: 03/22/1955    Quit date: 03/21/1965     Years since quitting: 58.8   Smokeless tobacco: Never  Substance Use Topics   Alcohol use: No    Alcohol/week: 0.0 standard drinks of alcohol    Allergies  Allergen Reactions   Sulfa Antibiotics Other (See Comments)    Unknown    Current Outpatient Medications  Medication Sig Dispense Refill   allopurinol  (ZYLOPRIM ) 300 MG tablet Take 300 mg by mouth daily.     amiodarone  (PACERONE ) 200 MG tablet Take 0.5 tablets (100 mg total) by mouth daily. 45 tablet 3   aspirin  81 MG tablet Take 81 mg by mouth every morning.      carvedilol  (COREG ) 25 MG tablet Take 25 mg by mouth 2 (two) times daily with a meal.     Cholecalciferol (VITAMIN D3) 5000 UNITS CAPS Take 5,000 Units by mouth every morning.     finasteride  (PROSCAR ) 5 MG tablet Take 5 mg by mouth daily.     folic acid  (FOLVITE ) 1 MG tablet Take 1 tablet (1 mg total) by mouth every evening. 90 tablet 3   magnesium oxide (MAG-OX) 400 (240 Mg) MG tablet Take 400 mg by mouth daily.     metFORMIN (GLUCOPHAGE) 500 MG tablet Take 500 mg by mouth daily.     methotrexate  (RHEUMATREX) 2.5 MG tablet Take 10 mg by mouth once a week. Caution:Chemotherapy. Protect from light. Take 4 once a week     Multiple Vitamins-Minerals (PRESERVISION AREDS 2 PO) Take 1 tablet by mouth 2 (two) times daily.     rosuvastatin  (CRESTOR ) 5 MG tablet Take 1 tablet (5 mg total) by mouth daily. 24 tablet 3   tamsulosin (FLOMAX) 0.4 MG CAPS capsule Take 0.4 mg by mouth daily.     No current facility-administered medications for this visit.    REVIEW OF SYSTEMS  All other systems reviewed and are negative  Objective:  Objective   Vitals:   01/09/24 1029  BP: (!) 155/72  Pulse: 69  Resp: 18  Temp: 97.9 F (36.6 C)  TempSrc: Temporal  SpO2: 98%  Weight: 175 lb 9.6 oz (79.7 kg)  Height: 5\' 10"  (1.778 m)   Body mass index is 25.2 kg/m.  Physical Exam General: no acute distress Cardiac: hemodynamically stable Pulm: normal work of breathing Neuro:  alert, no focal deficit Extremities: no edema, cyanosis or wounds Vascular:   Right: Palpable radial  Left: Palpable radial  Data: Carotid duplex Right Carotid Findings:  +----------+--------+--------+--------+------------------+---------+           PSV cm/sEDV cm/sStenosisPlaque DescriptionComments   +----------+--------+--------+--------+------------------+---------+  CCA Prox  64      11                                           +----------+--------+--------+--------+------------------+---------+  CCA  Distal49      14                                           +----------+--------+--------+--------+------------------+---------+  ICA Prox  86      16                                           +----------+--------+--------+--------+------------------+---------+  ICA Mid   116     30      1-39%   calcific          Shadowing  +----------+--------+--------+--------+------------------+---------+  ICA Distal120     31                                           +----------+--------+--------+--------+------------------+---------+  ECA      75                                                   +----------+--------+--------+--------+------------------+---------+   +----------+--------+-------+----------------+-------------------+           PSV cm/sEDV cmsDescribe        Arm Pressure (mmHG)  +----------+--------+-------+----------------+-------------------+  [id number]            Multiphasic, [id number]                  +----------+--------+-------+----------------+-------------------+   +---------+--------+--+--------+--+---------+  VertebralPSV cm/s77EDV cm/s13Antegrade  +---------+--------+--+--------+--+---------+      Left Carotid Findings:  +----------+--------+--------+--------+------------------+---------+           PSV cm/sEDV cm/sStenosisPlaque DescriptionComments    +----------+--------+--------+--------+------------------+---------+  CCA Prox  68      13                                           +----------+--------+--------+--------+------------------+---------+  CCA Distal71      13                                           +----------+--------+--------+--------+------------------+---------+  ICA Prox  75      17                                           +----------+--------+--------+--------+------------------+---------+  ICA Mid   80      22      1-39%   calcific          Shadowing  +----------+--------+--------+--------+------------------+---------+  ICA Distal112     28                                           +----------+--------+--------+--------+------------------+---------+  ECA      114  10                                           +----------+--------+--------+--------+------------------+---------+   +----------+--------+--------+----------------+-------------------+           PSV cm/sEDV cm/sDescribe        Arm Pressure (mmHG)  +----------+--------+--------+----------------+-------------------+  [id number]             Multiphasic, [id number]                  +----------+--------+--------+----------------+-------------------+   +---------+--------+--------+--------------+  VertebralPSV cm/sEDV cm/sNot identified  +---------+--------+--------+--------------+      Assessment/Plan:     VYOM BRASS is a 89 y.o. male with with asymptomatic carotid stenosis, 1 to 39% bilaterally and an incidental finding of a left vertebral occlusion.  Explained that he is a very low risk of stroke and there is no role in vertebral revascularization.  Will plan to continue with surveillance and follow-up in 1 year.  If carotid velocities are stable at that time can discontinue surveillance.      Philipp Brawn MD [facility]

## 2024-01-09 ENCOUNTER — Encounter: Payer: Self-pay | Admitting: Vascular Surgery

## 2024-01-09 ENCOUNTER — Ambulatory Visit: Attending: Vascular Surgery | Admitting: Vascular Surgery

## 2024-01-09 VITALS — BP 155/72 | HR 69 | Temp 97.9°F | Resp 18 | Ht 70.0 in | Wt 175.6 lb

## 2024-01-09 DIAGNOSIS — I6523 Occlusion and stenosis of bilateral carotid arteries: Secondary | ICD-10-CM

## 2024-01-29 DIAGNOSIS — E1151 Type 2 diabetes mellitus with diabetic peripheral angiopathy without gangrene: Secondary | ICD-10-CM | POA: Diagnosis not present

## 2024-01-29 DIAGNOSIS — M25512 Pain in left shoulder: Secondary | ICD-10-CM | POA: Diagnosis not present

## 2024-01-29 DIAGNOSIS — M958 Other specified acquired deformities of musculoskeletal system: Secondary | ICD-10-CM | POA: Diagnosis not present

## 2024-01-29 DIAGNOSIS — M160 Bilateral primary osteoarthritis of hip: Secondary | ICD-10-CM | POA: Diagnosis not present

## 2024-01-29 DIAGNOSIS — Z6824 Body mass index (BMI) 24.0-24.9, adult: Secondary | ICD-10-CM | POA: Diagnosis not present

## 2024-01-29 DIAGNOSIS — M25552 Pain in left hip: Secondary | ICD-10-CM | POA: Diagnosis not present

## 2024-01-29 DIAGNOSIS — M25551 Pain in right hip: Secondary | ICD-10-CM | POA: Diagnosis not present

## 2024-01-29 DIAGNOSIS — L4050 Arthropathic psoriasis, unspecified: Secondary | ICD-10-CM | POA: Diagnosis not present

## 2024-05-10 DIAGNOSIS — B351 Tinea unguium: Secondary | ICD-10-CM | POA: Diagnosis not present

## 2024-05-19 DIAGNOSIS — E559 Vitamin D deficiency, unspecified: Secondary | ICD-10-CM | POA: Diagnosis not present

## 2024-05-19 DIAGNOSIS — E782 Mixed hyperlipidemia: Secondary | ICD-10-CM | POA: Diagnosis not present

## 2024-05-19 DIAGNOSIS — E1151 Type 2 diabetes mellitus with diabetic peripheral angiopathy without gangrene: Secondary | ICD-10-CM | POA: Diagnosis not present

## 2024-05-19 DIAGNOSIS — M1A09X0 Idiopathic chronic gout, multiple sites, without tophus (tophi): Secondary | ICD-10-CM | POA: Diagnosis not present

## 2024-05-19 DIAGNOSIS — Z125 Encounter for screening for malignant neoplasm of prostate: Secondary | ICD-10-CM | POA: Diagnosis not present

## 2024-05-19 DIAGNOSIS — L4050 Arthropathic psoriasis, unspecified: Secondary | ICD-10-CM | POA: Diagnosis not present

## 2024-05-25 DIAGNOSIS — M25552 Pain in left hip: Secondary | ICD-10-CM | POA: Diagnosis not present

## 2024-05-25 DIAGNOSIS — M25551 Pain in right hip: Secondary | ICD-10-CM | POA: Diagnosis not present

## 2024-05-25 DIAGNOSIS — L4050 Arthropathic psoriasis, unspecified: Secondary | ICD-10-CM | POA: Diagnosis not present

## 2024-05-25 DIAGNOSIS — E1151 Type 2 diabetes mellitus with diabetic peripheral angiopathy without gangrene: Secondary | ICD-10-CM | POA: Diagnosis not present

## 2024-05-25 DIAGNOSIS — Z9181 History of falling: Secondary | ICD-10-CM | POA: Diagnosis not present

## 2024-05-25 DIAGNOSIS — Z0000 Encounter for general adult medical examination without abnormal findings: Secondary | ICD-10-CM | POA: Diagnosis not present

## 2024-05-25 DIAGNOSIS — Z1339 Encounter for screening examination for other mental health and behavioral disorders: Secondary | ICD-10-CM | POA: Diagnosis not present

## 2024-05-25 DIAGNOSIS — M160 Bilateral primary osteoarthritis of hip: Secondary | ICD-10-CM | POA: Diagnosis not present

## 2024-05-25 DIAGNOSIS — Z23 Encounter for immunization: Secondary | ICD-10-CM | POA: Diagnosis not present

## 2024-06-14 DIAGNOSIS — R070 Pain in throat: Secondary | ICD-10-CM | POA: Diagnosis not present

## 2024-06-14 DIAGNOSIS — Z6826 Body mass index (BMI) 26.0-26.9, adult: Secondary | ICD-10-CM | POA: Diagnosis not present

## 2024-06-14 DIAGNOSIS — R519 Headache, unspecified: Secondary | ICD-10-CM | POA: Diagnosis not present

## 2024-06-14 DIAGNOSIS — R059 Cough, unspecified: Secondary | ICD-10-CM | POA: Diagnosis not present

## 2024-06-16 DIAGNOSIS — L4050 Arthropathic psoriasis, unspecified: Secondary | ICD-10-CM | POA: Diagnosis not present

## 2024-06-16 DIAGNOSIS — N189 Chronic kidney disease, unspecified: Secondary | ICD-10-CM | POA: Diagnosis not present

## 2024-06-16 DIAGNOSIS — N529 Male erectile dysfunction, unspecified: Secondary | ICD-10-CM | POA: Diagnosis not present

## 2024-06-16 DIAGNOSIS — N400 Enlarged prostate without lower urinary tract symptoms: Secondary | ICD-10-CM | POA: Diagnosis not present

## 2024-06-16 DIAGNOSIS — Z79631 Long term (current) use of antimetabolite agent: Secondary | ICD-10-CM | POA: Diagnosis not present

## 2024-06-16 DIAGNOSIS — I2510 Atherosclerotic heart disease of native coronary artery without angina pectoris: Secondary | ICD-10-CM | POA: Diagnosis not present

## 2024-06-16 DIAGNOSIS — I129 Hypertensive chronic kidney disease with stage 1 through stage 4 chronic kidney disease, or unspecified chronic kidney disease: Secondary | ICD-10-CM | POA: Diagnosis not present

## 2024-06-16 DIAGNOSIS — M109 Gout, unspecified: Secondary | ICD-10-CM | POA: Diagnosis not present

## 2024-06-16 DIAGNOSIS — B351 Tinea unguium: Secondary | ICD-10-CM | POA: Diagnosis not present

## 2024-06-16 DIAGNOSIS — E785 Hyperlipidemia, unspecified: Secondary | ICD-10-CM | POA: Diagnosis not present

## 2024-06-16 DIAGNOSIS — I252 Old myocardial infarction: Secondary | ICD-10-CM | POA: Diagnosis not present

## 2024-06-16 DIAGNOSIS — Z951 Presence of aortocoronary bypass graft: Secondary | ICD-10-CM | POA: Diagnosis not present

## 2024-06-16 DIAGNOSIS — J40 Bronchitis, not specified as acute or chronic: Secondary | ICD-10-CM | POA: Diagnosis not present

## 2024-06-16 DIAGNOSIS — Z7952 Long term (current) use of systemic steroids: Secondary | ICD-10-CM | POA: Diagnosis not present

## 2024-06-16 DIAGNOSIS — Z7982 Long term (current) use of aspirin: Secondary | ICD-10-CM | POA: Diagnosis not present

## 2024-06-16 DIAGNOSIS — E1122 Type 2 diabetes mellitus with diabetic chronic kidney disease: Secondary | ICD-10-CM | POA: Diagnosis not present

## 2024-06-22 ENCOUNTER — Ambulatory Visit

## 2024-06-22 VITALS — BP 122/84 | HR 73 | Ht 70.0 in | Wt 179.8 lb

## 2024-06-22 DIAGNOSIS — I10 Essential (primary) hypertension: Secondary | ICD-10-CM | POA: Diagnosis not present

## 2024-06-22 DIAGNOSIS — I472 Ventricular tachycardia, unspecified: Secondary | ICD-10-CM

## 2024-06-22 DIAGNOSIS — I25118 Atherosclerotic heart disease of native coronary artery with other forms of angina pectoris: Secondary | ICD-10-CM | POA: Diagnosis not present

## 2024-06-22 DIAGNOSIS — E782 Mixed hyperlipidemia: Secondary | ICD-10-CM

## 2024-06-22 MED ORDER — SIMVASTATIN 10 MG PO TABS: 10.0000 mg | ORAL_TABLET | ORAL | 3 refills | Status: AC

## 2024-06-22 NOTE — Assessment & Plan Note (Signed)
 S/p CABG in 2005. Last cardiac cath December 2022 done in setting of sustained monomorphic VT with severe native vessel disease with patent grafts LIMA-LAD, sequential grafts to OM1 and OM 2 and sequential grafts to distal RCA and RPDA.   Remains asymptomatic good functional status. Continue aspirin  81 mg once daily. Did not tolerate rosuvastatin . Will resume simvastatin 10 mg 3 times a week.

## 2024-06-22 NOTE — Progress Notes (Signed)
 Cardiology Consultation:    Date:  06/22/2024   ID:  Dan Holmes, DOB 11-01-1931, MRN [id number]  PCP:  Street, Dan HERO, MD  Cardiologist:  Dan SAUNDERS Shequita Peplinski, MD   Referring MD: Street, Dan Holmes, *   No chief complaint on file.    ASSESSMENT AND PLAN:   Dan Holmes 89 year old male with history of CAD s/p CABG in 2005 with symptomatic sustained monomorphic VT in November 2022 with echocardiogram at the time showing LVEF 50 to 55% and basal inferior segment hypokinesis, cardiac catheterization December 2022 noted severe native vessel disease with patent grafts, was empirically started on amiodarone , later self discontinued at some point after January 2023.  Heart monitor follow-up for a flutter like sensation in the chest correlating with isolated PVCs showed occasional PVC burden 2.7%, occasional supraventricular ectopic beats 1.4%, short run of NSVT up to 10 beats and patient triggered events correlating with isolated ventricular and supraventricular ectopic beats. echocardiogram from May 2025 noted normal biventricular function with moderate LVH and EF 60 to 65% and mild TR was noted with trace aortic insufficiency and Dan. Bilateral less than 40% carotid artery stenosis, incidental finding of left vertebral occlusion, asymptomatic, no further intervention indicated and has established care with vascular surgeon Dan Holmes and has surveillance follow-up in 1 year. Also has history of hypertension, hyperlipidemia,psoriasis, gout, CKD stage III, diabetes.  Here for routine follow-up visit.  Problem List Items Addressed This Visit     Essential hypertension   Well-controlled. Continue carvedilol  25 mg twice daily.      Hyperlipidemia   Did not tolerate Crestor . Resume simvastatin 10 mg 3 times a week.      CAD (coronary artery disease) - Primary   S/p CABG in 2005. Last cardiac cath December 2022 done in setting of sustained monomorphic VT with severe native  vessel disease with patent grafts LIMA-LAD, sequential grafts to OM1 and OM 2 and sequential grafts to distal RCA and RPDA.   Remains asymptomatic good functional status. Continue aspirin  81 mg once daily. Did not tolerate rosuvastatin . Will resume simvastatin 10 mg 3 times a week.        Relevant Orders   EKG 12-Lead (Completed)   Ventricular tachycardia (HCC)   History of VT episode back in 2022.  Subsequent cath showed no revascularization targets. Was empirically placed on amiodarone  which he has not tolerated well and self discontinued before.  Reattempted trying low-dose amiodarone  this year and did not tolerate well.  Continue with carvedilol  25 mg twice daily.        Return to clinic in 6 months.  History of Present Illness:    Dan Holmes is a 89 y.o. male who is being seen today for follow-up visit. PCP is Street, Dan Holmes, *.   history of CAD s/p CABG in 2005 with symptomatic sustained monomorphic VT in November 2022 with echocardiogram at the time showing LVEF 50 to 55% and basal inferior segment hypokinesis, cardiac catheterization December 2022 noted severe native vessel disease with patent grafts, was empirically started on amiodarone , later self discontinued at some point after January 2023.  Heart monitor follow-up for a flutter like sensation in the chest correlating with isolated PVCs showed occasional PVC burden 2.7%, occasional supraventricular ectopic beats 1.4%, short run of NSVT up to 10 beats and patient triggered events correlating with isolated ventricular and supraventricular ectopic beats. echocardiogram from May 2025 noted normal biventricular function with moderate LVH and EF 60 to 65% and mild TR  was noted with trace aortic insufficiency and Dan. Bilateral less than 40% carotid artery stenosis, incidental finding of left vertebral occlusion, asymptomatic, no further intervention indicated and has established care with vascular surgeon Dr.  Pearline and has surveillance follow-up in 1 year. Also has history of hypertension, hyperlipidemia,psoriasis, gout, CKD stage III, diabetes.  Here for the visit today accompanied by his wife. He is in good spirits since he celebrated his birthday yesterday.  Mentions overall he is been doing well. Mentions amiodarone  that was restarted for symptomatic PVCs was discontinued as he felt lightheadedness symptoms while on it. He also self discontinued rosuvastatin  around the same time as he was unclear which medication was causing symptoms.  Denies any further symptoms since discontinuing the medications. Denies any chest pain, shortness of breath, orthopnea paroxysmal nocturnal dyspnea. No pedal edema.  No syncopal episodes.  Good compliance with the rest of his medications.  EKG in the clinic today shows sinus rhythm heart rate 73/min, occasional isolated PVC. PR interval 158 ms, QRS duration 80 ms, QTc 445 ms.  Past Medical History:  Diagnosis Date   Abdominal pain 01/27/2016   Abnormal carotid ultrasound 04/23/2018   Abnormal LFTs    Acute kidney injury 11/26/2023   Arthritis    ASCVD (arteriosclerotic cardiovascular disease)    BPH (benign prostatic hyperplasia) 11/26/2023   CAD (coronary artery disease)    January, 2012, nuclear, EF 61%, normal   Carotid artery disease    Doppler, August, 2011, 40-59% RIC A., 0-39% LICA, followup one year   Carpal tunnel syndrome    Cholecystitis 11/13/2016   Chronic anemia 11/26/2023   Chronic gout without tophus 06/19/2016   Coronary artery disease involving native coronary artery of native heart 06/28/2015   January, 2012, nuclear, EF 61%, normal   Coronary artery disease involving native coronary artery of native heart without angina pectoris 06/28/2015   January, 2012, nuclear, EF 61%, normal     Cough due to ACE inhibitor 12/16/2014   The patient's case was changed to an ARB in March, 2016.    DDD (degenerative disc disease), cervical  06/19/2016   DDD (degenerative disc disease), lumbar 06/19/2016   Spinal Fusion   Diabetes mellitus (HCC)    IMO SNOMED Dx Update Oct 2024     Dyslipidemia 05/28/2013   Joint pains that may have been related to Crestor .    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   Mild aortic insufficiency 11/27/2023   Mild mitral regurgitation 11/27/2023   Near syncope 07/27/2021   Old MI (myocardial infarction) 02/12/2018   Orthostatic hypotension 11/26/2023   Osteoporosis 06/19/2016   Pain 02/2010   right neck, right shoulder, right wrist, right leg-not cardiac   Pancreatitis 01/27/2016   Pancreatitis, acute    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 Obadiah -- LIMA-LAD, SeqSVG-OM1-OM2, SeqSVG-dRCA-PDA   LEFT HEART CATH AND CORONARY ANGIOGRAPHY  02/13/2004   (  Dr. Karyle): p-mLAD long 80%, LCx branches into OM1& OM2 (~AVG occluded) - OM1 ~80%, LCx-OM2 ~80%, Ost-prox RCA 90%, Tandem 90% ostial & 80% proximal rPDA.  EF ~60% with inferior HK.   LEFT HEART CATH AND CORS/GRAFTS ANGIOGRAPHY N/A 07/27/2021   Procedure: LEFT HEART CATH AND CORS/GRAFTS ANGIOGRAPHY;  Surgeon: Anner Alm ORN, MD;  Location: [facility] INVASIVE CV LAB;  Service: Cardiovascular;  Laterality: N/A;   SPINAL FUSION      Current Medications: Current Meds  Medication Sig   allopurinol  (ZYLOPRIM ) 300 MG tablet Take 300 mg by mouth daily.   aspirin  81 MG tablet Take 81 mg by mouth every  morning.    carvedilol  (COREG ) 25 MG tablet Take 25 mg by mouth 2 (two) times daily with a meal.   cefdinir (OMNICEF) 300 MG capsule Take 300 mg by mouth 2 (two) times daily.   Cholecalciferol (VITAMIN D3) 5000 UNITS CAPS Take 5,000 Units by mouth every morning.   finasteride  (PROSCAR ) 5 MG tablet Take 5 mg by mouth daily.   folic acid  (FOLVITE ) 1 MG tablet Take 1 tablet (1 mg total) by mouth every evening.   guaiFENesin-codeine 100-10 MG/5ML syrup Take 5 mLs by mouth.   magnesium oxide (MAG-OX) 400 (240 Mg) MG tablet Take 400 mg by mouth daily.   metFORMIN (GLUCOPHAGE) 500 MG tablet Take 500 mg by mouth daily.   methotrexate  (RHEUMATREX) 2.5 MG tablet Take 10 mg by mouth once a week. Caution:Chemotherapy. Protect from light. Take 4 once a week   Multiple Vitamins-Minerals (PRESERVISION AREDS 2 PO) Take 1 tablet by mouth 2 (two) times daily.   tamsulosin (FLOMAX) 0.4 MG CAPS capsule Take 0.4 mg by mouth daily.   terbinafine (LAMISIL) 250 MG tablet Take 250 mg by mouth daily.     Allergies:   Sulfa antibiotics   Social History   Socioeconomic History   Marital status: Married    Spouse name: Not on file   Number of children: Not on file   Years of education: Not on file   Highest education level: Not on file  Occupational History   Not on file  Tobacco Use   Smoking status: Former    Current packs/day: 0.00    Average packs/day: 0.5 packs/day for 10.0 years (5.0 ttl pk-yrs)    Types: Cigarettes    Start date: 03/22/1955    Quit date: 03/21/1965    Years since quitting: 59.2   Smokeless tobacco: Never  Vaping Use   Vaping status: Never Used  Substance and Sexual Activity   Alcohol use: No    Alcohol/week: 0.0 standard drinks of alcohol   Drug use: Never   Sexual activity: Not on file  Other Topics Concern   Not on file  Social History Narrative   Level of education: High school    Employment: retired, [employer] maintenance       Social Drivers of Corporate Investment Banker  Strain: Not on file  Food Insecurity: Not on file  Transportation Needs: Not on file  Physical Activity: Not on file  Stress: Not on file  Social Connections: Not on file     Family History: The patient's family history includes CVA in his mother; Heart attack in his father; Heart disease in his brother; Stroke in his mother. ROS:   Please see the history of present illness.    All 14 point review of systems negative except as described per history of present illness.  EKGs/Labs/Other Studies Reviewed:  The following studies were reviewed today:   EKG:  EKG Interpretation Date/Time:  Tuesday June 22 2024 10:57:15 EST Ventricular Rate:  73 PR Interval:  158 QRS Duration:  80 QT Interval:  404 QTC Calculation: 445 R Axis:   83  Text Interpretation: Sinus rhythm with occasional Premature ventricular complexes Otherwise normal ECG When compared with ECG of 29-Dec-2023 08:15, Premature ventricular complexes are now Present PR interval has decreased Confirmed by Liborio Hai reddy [phone]) on 06/22/2024 11:22:51 AM    Recent Labs: No results found for requested labs within last 365 days.  Recent Lipid Panel    Component Value Date/Time   CHOL 93 01/28/2016 0453   TRIG 67 01/28/2016 0453   HDL 33 (L) 01/28/2016 0453   CHOLHDL 2.8 01/28/2016 0453   VLDL 13 01/28/2016 0453   LDLCALC 47 01/28/2016 0453    Physical Exam:    VS:  BP 122/84   Pulse 73   Ht 5' 10 (1.778 m)   Wt 179 lb 12.8 oz (81.6 kg)   SpO2 96%   BMI 25.80 kg/m     Wt Readings from Last 3 Encounters:  06/22/24 179 lb 12.8 oz (81.6 kg)  01/09/24 175 lb 9.6 oz (79.7 kg)  12/29/23 175 lb 12.8 oz (79.7 kg)     GENERAL:  Well nourished, well developed in no acute distress NECK: No JVD; No carotid bruits CARDIAC: RRR, S1 and S2 present, no murmurs, no rubs, no gallops CHEST:  Clear to auscultation without rales, wheezing or rhonchi  Extremities: No pitting pedal edema. Pulses bilaterally  symmetric with radial 2+ and dorsalis pedis 2+ NEUROLOGIC:  Alert and oriented x 3  Medication Adjustments/Labs and Tests Ordered: Current medicines are reviewed at length with the patient today.  Concerns regarding medicines are outlined above.  Orders Placed This Encounter  Procedures   EKG 12-Lead   No orders of the defined types were placed in this encounter.   Signed, Genean Adamski reddy Kaula Klenke, MD, MPH, [facility]. 06/22/2024 11:40 AM    [facility]

## 2024-06-22 NOTE — Assessment & Plan Note (Signed)
 Well-controlled.  Continue carvedilol 25 mg twice daily.

## 2024-06-22 NOTE — Assessment & Plan Note (Signed)
 History of VT episode back in 2022.  Subsequent cath showed no revascularization targets. Was empirically placed on amiodarone  which he has not tolerated well and self discontinued before.  Reattempted trying low-dose amiodarone  this year and did not tolerate well.  Continue with carvedilol  25 mg twice daily.

## 2024-06-22 NOTE — Patient Instructions (Signed)
 Medication Instructions:  Your physician has recommended you make the following change in your medication:   START: Simvastatin 10 mg  three times weekly  *If you need a refill on your cardiac medications before your next appointment, please call your pharmacy*  Lab Work: None If you have labs (blood work) drawn today and your tests are completely normal, you will receive your results only by: MyChart Message (if you have MyChart) OR A paper copy in the mail If you have any lab test that is abnormal or we need to change your treatment, we will call you to review the results.  Testing/Procedures: None  Follow-Up: At [facility], you and your health needs are our priority.  As part of our continuing mission to provide you with exceptional heart care, our providers are all part of one team.  This team includes your primary Cardiologist (physician) and Advanced Practice Providers or APPs (Physician Assistants and Nurse Practitioners) who all work together to provide you with the care you need, when you need it.  Your next appointment:   6 month(s)  Provider:   Alean Kobus, MD    We recommend signing up for the patient portal called MyChart.  Sign up information is provided on this After Visit Summary.  MyChart is used to connect with patients for Virtual Visits (Telemedicine).  Patients are able to view lab/test results, encounter notes, upcoming appointments, etc.  Non-urgent messages can be sent to your provider as well.   To learn more about what you can do with MyChart, go to [email or web address].   Other Instructions None

## 2024-06-22 NOTE — Assessment & Plan Note (Signed)
 Did not tolerate Crestor . Resume simvastatin 10 mg 3 times a week.

## 2024-06-28 ENCOUNTER — Ambulatory Visit
